# Patient Record
Sex: Male | Born: 1935 | Race: White | Hispanic: Yes | Marital: Married | Smoking: Former smoker
Health system: Southern US, Community
[De-identification: ages and names within clinical notes are randomized; demographics above are authoritative.]

## PROBLEM LIST (undated history)

## (undated) DIAGNOSIS — N183 Chronic kidney disease, stage 3 (moderate): Secondary | ICD-10-CM

## (undated) DIAGNOSIS — Z95810 Presence of automatic (implantable) cardiac defibrillator: Secondary | ICD-10-CM

## (undated) DIAGNOSIS — Z950 Presence of cardiac pacemaker: Secondary | ICD-10-CM

## (undated) DIAGNOSIS — Z8546 Personal history of malignant neoplasm of prostate: Secondary | ICD-10-CM

## (undated) DIAGNOSIS — C801 Malignant (primary) neoplasm, unspecified: Secondary | ICD-10-CM

## (undated) DIAGNOSIS — G4733 Obstructive sleep apnea (adult) (pediatric): Secondary | ICD-10-CM

## (undated) DIAGNOSIS — I340 Nonrheumatic mitral (valve) insufficiency: Secondary | ICD-10-CM

## (undated) DIAGNOSIS — I4891 Unspecified atrial fibrillation: Secondary | ICD-10-CM

## (undated) DIAGNOSIS — K7581 Nonalcoholic steatohepatitis (NASH): Secondary | ICD-10-CM

## (undated) DIAGNOSIS — I509 Heart failure, unspecified: Secondary | ICD-10-CM

## (undated) DIAGNOSIS — D696 Thrombocytopenia, unspecified: Secondary | ICD-10-CM

## (undated) DIAGNOSIS — I10 Essential (primary) hypertension: Secondary | ICD-10-CM

## (undated) HISTORY — PX: UVULECTOMY: SHX2631

## (undated) HISTORY — PX: APPENDECTOMY: SHX54

## (undated) HISTORY — PX: KNEE ARTHROSCOPY: SUR90

## (undated) HISTORY — PX: OTHER SURGICAL HISTORY: SHX169

## (undated) HISTORY — PX: COLON SURGERY: SHX602

## (undated) HISTORY — PX: SHOULDER SURGERY: SHX246

## (undated) HISTORY — PX: CHOLECYSTECTOMY: SHX55

---

## 1998-10-21 ENCOUNTER — Ambulatory Visit: Admission: RE | Admit: 1998-10-21 | Discharge: 1998-10-21 | Payer: Self-pay

## 2003-06-19 ENCOUNTER — Observation Stay (HOSPITAL_COMMUNITY): Admission: EM | Admit: 2003-06-19 | Discharge: 2003-06-19 | Payer: Self-pay | Admitting: Emergency Medicine

## 2006-02-19 ENCOUNTER — Emergency Department (HOSPITAL_COMMUNITY): Admission: EM | Admit: 2006-02-19 | Discharge: 2006-02-19 | Payer: Self-pay | Admitting: Emergency Medicine

## 2009-09-09 ENCOUNTER — Ambulatory Visit: Payer: Self-pay | Admitting: Infectious Diseases

## 2009-09-09 ENCOUNTER — Inpatient Hospital Stay (HOSPITAL_COMMUNITY): Admission: EM | Admit: 2009-09-09 | Discharge: 2009-09-10 | Payer: Self-pay | Admitting: Emergency Medicine

## 2011-01-15 LAB — COMPREHENSIVE METABOLIC PANEL
Alkaline Phosphatase: 61 U/L (ref 39–117)
BUN: 20 mg/dL (ref 6–23)
CO2: 24 mEq/L (ref 19–32)
Calcium: 8.8 mg/dL (ref 8.4–10.5)
Chloride: 108 mEq/L (ref 96–112)
Creatinine, Ser: 1.05 mg/dL (ref 0.4–1.5)
GFR calc Af Amer: >60 mL/min (ref >60)
Total Bilirubin: 1.7 mg/dL — ABNORMAL HIGH (ref 0.3–1.2)

## 2011-01-15 LAB — CBC
MCV: 100.7 fL — ABNORMAL HIGH (ref 78.0–100.0)
Platelets: 69 10*3/uL — ABNORMAL LOW (ref 150–400)
RDW: 12.6 % (ref 11.5–15.5)
WBC: 5.4 10*3/uL (ref 4.0–10.5)

## 2011-01-15 LAB — HEMOGLOBIN A1C: Mean Plasma Glucose: 140 mg/dL

## 2011-01-15 LAB — CARDIAC PANEL(CRET KIN+CKTOT+MB+TROPI)
CK, MB: 2.6 ng/mL (ref 0.3–4.0)
Relative Index: 2.5 (ref 0.0–2.5)
Total CK: 102 U/L (ref 7–232)
Troponin I: 0.01 ng/mL (ref 0.00–0.06)
Troponin I: 0.01 ng/mL (ref 0.00–0.06)

## 2011-01-15 LAB — APTT: aPTT: 28 seconds (ref 24–37)

## 2011-01-15 LAB — LIPASE, BLOOD: Lipase: 28 U/L (ref 11–59)

## 2011-01-15 LAB — URINALYSIS, ROUTINE W REFLEX MICROSCOPIC
Glucose, UA: NEGATIVE mg/dL
Nitrite: NEGATIVE
Specific Gravity, Urine: 1.017 (ref 1.005–1.030)
pH: 5 (ref 5.0–8.0)

## 2011-01-15 LAB — CK TOTAL AND CKMB (NOT AT ARMC): CK, MB: 2.1 ng/mL (ref 0.3–4.0)

## 2011-01-15 LAB — DIFFERENTIAL
Basophils Relative: 0 % (ref 0–1)
Eosinophils Relative: 1 % (ref 0–5)
Lymphocytes Relative: 15 % (ref 12–46)
Lymphs Abs: 0.8 10*3/uL (ref 0.7–4.0)
Monocytes Relative: 7 % (ref 3–12)
Neutro Abs: 4.2 10*3/uL (ref 1.7–7.7)

## 2011-01-15 LAB — URINE CULTURE
Colony Count: NO GROWTH
Culture: NO GROWTH

## 2011-01-15 LAB — GLUCOSE, CAPILLARY: Glucose-Capillary: 178 mg/dL — ABNORMAL HIGH (ref 70–99)

## 2011-01-15 LAB — PROTIME-INR
INR: 1.42 (ref 0.00–1.49)
Prothrombin Time: 17.2 seconds — ABNORMAL HIGH (ref 11.6–15.2)

## 2011-01-15 LAB — POCT CARDIAC MARKERS: Troponin i, poc: <0.05 ng/mL (ref 0.00–0.09)

## 2011-01-15 LAB — TROPONIN I: Troponin I: 0.01 ng/mL (ref 0.00–0.06)

## 2011-02-28 NOTE — Discharge Summary (Signed)
NAME:  Cameron, Acosta                        ACCOUNT NO.:  [id number]   MEDICAL RECORD NO.:  [id number]                   PATIENT TYPE:  INP   LOCATION:  3730                                 FACILITY:  [facility]   PHYSICIAN:  Deirdre Peer. Polite, M.D.              DATE OF BIRTH:  1936-05-17   DATE OF ADMISSION:  06/18/2003  DATE OF DISCHARGE:  06/19/2003                                 DISCHARGE SUMMARY   DISCHARGE DIAGNOSES:  1. Chest pain, noncardiac in origin.  2. Chronic atrial fibrillation on Coumadin therapy.  3. Hypertension.  4. Sleep apnea.  5. Mild diabetes.  6. Depression.  7. Cardiomyopathy with an EF of 38%.  8. Status post colon resection.  9. History of pneumonia.  10.      History of kidney stones.  11.      Hypotension, resolved.  Responsive to intravenous fluids.  12.      Dyspnea secondary to hypotension, resolved.   DISCHARGE MEDICATIONS:  1. Toprol 50 mg p.o. q.h.s.  2. Zoloft 50 mg p.o. q.h.s.  3. Lanoxin 0.125 mg p.o. q.h.s.  4. Ambien p.r.n.  5. Coumadin 6 mg p.o. q.h.s.  NOTE:  Patient is instructed not to take his Cozaar.   ALLERGIES:  No known drug allergies.   PROCEDURES:  None.   HISTORY OF PRESENT ILLNESS:  This is a man with an extensive medical  history, who presents with complaint of severe dizziness after eating  dinner.  He also experienced diaphoresis during this episode.  He went and  laid down on the sofa, at which time, he got up and went to the bathroom.  He became very dizzy in the bathroom and fell forward, although he did not  pass out.  He does not recall much except that he was mildly short of  breath.  His wife is a Engineer, civil (consulting) and reported that his blood pressure was 80  systolic at home.  He was transported to the ER where his blood pressure  initially was 86/64, pulse of 86 with atrial fibrillation.  Blood pressure  responded quite quickly to IV fluid and no further arrhythmias were  observed.  He is admitted for telemetry  monitoring.   HOSPITAL COURSE:  A 12-lead EKG in the emergency department reveals atrial  fibrillation with good rate control with a heart rate of 73.  There are no  ST or T-wave changes.  There is no significant ectopy noted.  The patient is  admitted to the telemetry unit in which he stayed in atrial fibrillation  rhythm with good rate controls throughout his hospitalization.  Three sets  of cardiac enzymes were performed and found to be negative.  The patient was  maintained on his outpatient medication regimen with the exception of his  Cozaar as it does contain a diuretic and it was thought that it may have  contributed to the patient's dehydration and dizziness.  The patient is  instructed not to resume his Cozaar until he sees his primary M.D.  He is a  patient of Dr. Bary Castilla, Cardiologist at [facility].  He is instructed to  follow up with their office as well.   The morning after admission, the patient denied any chest pain, shortness of  breath, nausea or dizziness.  He did not have a fever.  His vital signs were  stable.  Once his third set of cardiac enzymes came back, he was discharged  to home with a diagnosis of hypotension secondary to dehydration, likely  caused by diuretic use.  He is encouraged to keep up his fluid intake and to  see his primary M.D. and his cardiologist as soon as possible.   At the time of discharge, temperature is 99.2, blood pressure 140/70,  saturations are 97%, heart rate 78.   DISCHARGE LABORATORY DATA:  Sodium 141, potassium 3.9, glucose 119, BUN 16,  creatinine 0.9, total bilirubin 1.2, ALP 40, AST 22, ALT 31.  White blood  cell count 4.8, hemoglobin 15.2, hematocrit 43.2, platelet count 132,000.  UA was negative for infection or glucose, however, was positive for ketones  at the time of admission.  PTT 34, PT 20.8, INR 2.3, digoxin level less than  0.2.   CONSULTATIONS:  None.   DISCHARGE CONDITION:  Good.   DISPOSITION:  Discharged to  home with wife.   FOLLOW UP:  The patient is instructed to call his primary M.D. and his  Cardiologist, Dr. Assunta Found for followup as soon as possible.      Ellender Hose. Virl Son. Polite, M.D.    SMD/MEDQ  D:  06/19/2003  T:  06/19/2003  Job:  [id number]   cc:   [facility] Dr. Horris [phone]

## 2011-09-10 ENCOUNTER — Emergency Department (HOSPITAL_COMMUNITY)
Admission: EM | Admit: 2011-09-10 | Discharge: 2011-09-10 | Disposition: A | Discharge status: Home / Self Care | Payer: Medicare Other | Attending: Emergency Medicine | Admitting: Emergency Medicine

## 2011-09-10 ENCOUNTER — Other Ambulatory Visit: Payer: Self-pay

## 2011-09-10 ENCOUNTER — Encounter: Payer: Self-pay | Admitting: Emergency Medicine

## 2011-09-10 ENCOUNTER — Emergency Department (HOSPITAL_COMMUNITY): Payer: Medicare Other

## 2011-09-10 DIAGNOSIS — R0609 Other forms of dyspnea: Secondary | ICD-10-CM | POA: Insufficient documentation

## 2011-09-10 DIAGNOSIS — R61 Generalized hyperhidrosis: Secondary | ICD-10-CM | POA: Insufficient documentation

## 2011-09-10 DIAGNOSIS — I4891 Unspecified atrial fibrillation: Secondary | ICD-10-CM | POA: Insufficient documentation

## 2011-09-10 DIAGNOSIS — R404 Transient alteration of awareness: Secondary | ICD-10-CM | POA: Insufficient documentation

## 2011-09-10 DIAGNOSIS — R0600 Dyspnea, unspecified: Secondary | ICD-10-CM

## 2011-09-10 DIAGNOSIS — E669 Obesity, unspecified: Secondary | ICD-10-CM | POA: Insufficient documentation

## 2011-09-10 DIAGNOSIS — R0682 Tachypnea, not elsewhere classified: Secondary | ICD-10-CM | POA: Insufficient documentation

## 2011-09-10 DIAGNOSIS — R609 Edema, unspecified: Secondary | ICD-10-CM | POA: Insufficient documentation

## 2011-09-10 DIAGNOSIS — I10 Essential (primary) hypertension: Secondary | ICD-10-CM | POA: Insufficient documentation

## 2011-09-10 DIAGNOSIS — R0989 Other specified symptoms and signs involving the circulatory and respiratory systems: Secondary | ICD-10-CM | POA: Insufficient documentation

## 2011-09-10 DIAGNOSIS — J811 Chronic pulmonary edema: Secondary | ICD-10-CM

## 2011-09-10 HISTORY — DX: Unspecified atrial fibrillation: I48.91

## 2011-09-10 HISTORY — DX: Essential (primary) hypertension: I10

## 2011-09-10 HISTORY — DX: Malignant (primary) neoplasm, unspecified: C80.1

## 2011-09-10 LAB — BASIC METABOLIC PANEL
BUN: 35 mg/dL — ABNORMAL HIGH (ref 6–23)
CO2: 22 mEq/L (ref 19–32)
Calcium: 8.6 mg/dL (ref 8.4–10.5)
Chloride: 103 mEq/L (ref 96–112)
Creatinine, Ser: 1.3 mg/dL (ref 0.50–1.35)
GFR calc Af Amer: 61 mL/min — ABNORMAL LOW (ref >90)
GFR calc non Af Amer: 53 mL/min — ABNORMAL LOW (ref >90)
Glucose, Bld: 199 mg/dL — ABNORMAL HIGH (ref 70–99)
Potassium: 4.3 mEq/L (ref 3.5–5.1)
Sodium: 137 mEq/L (ref 135–145)

## 2011-09-10 LAB — CBC
HCT: 43.8 % (ref 39.0–52.0)
Hemoglobin: 15.6 g/dL (ref 13.0–17.0)
MCH: 33.3 pg (ref 26.0–34.0)
MCHC: 35.6 g/dL (ref 30.0–36.0)
MCV: 93.4 fL (ref 78.0–100.0)
Platelets: 104 10*3/uL — ABNORMAL LOW (ref 150–400)
RBC: 4.69 MIL/uL (ref 4.22–5.81)
RDW: 16.2 % — ABNORMAL HIGH (ref 11.5–15.5)
WBC: 7 10*3/uL (ref 4.0–10.5)

## 2011-09-10 LAB — PROTIME-INR
INR: 2.97 — ABNORMAL HIGH (ref 0.00–1.49)
Prothrombin Time: 31.4 seconds — ABNORMAL HIGH (ref 11.6–15.2)

## 2011-09-10 LAB — TROPONIN I: Troponin I: <0.3 ng/mL (ref <0.30)

## 2011-09-10 LAB — PRO B NATRIURETIC PEPTIDE: Pro B Natriuretic peptide (BNP): 475 pg/mL — ABNORMAL HIGH (ref 0–125)

## 2011-09-10 MED ORDER — ONDANSETRON HCL 4 MG/2ML IJ SOLN: 4.0000 mg | Freq: Three times a day (TID) | INTRAMUSCULAR | Status: DC | PRN

## 2011-09-10 MED ORDER — FUROSEMIDE 10 MG/ML IJ SOLN
INTRAMUSCULAR | Status: AC
  Administered 2011-09-10: 03:00:00 via INTRAVENOUS
  Filled 2011-09-10: qty 2

## 2011-09-10 MED ORDER — FUROSEMIDE 10 MG/ML IJ SOLN
INTRAMUSCULAR | Status: AC
  Filled 2011-09-10: qty 8

## 2011-09-10 MED ORDER — FUROSEMIDE 10 MG/ML IJ SOLN
INTRAMUSCULAR | Status: AC
  Administered 2011-09-10: 02:00:00 via INTRAVENOUS
  Filled 2011-09-10: qty 8

## 2011-09-10 MED ORDER — FUROSEMIDE 10 MG/ML IJ SOLN
INTRAMUSCULAR | Status: AC
  Filled 2011-09-10: qty 2

## 2011-09-10 NOTE — ED Provider Notes (Addendum)
History    75yM with brought in by EMS in respiratory distress. Acute and progressive dyspnea since this afternoon. Pt diaphoretic and tripoding on their arrival. CPAP, nitro and lasix in route. Per EMS and pt, symptoms improved. Denies CP now or previously. No fever or chills. No n/v. Denies recent med changes and reports compliance. Uses CPAP at night. Complicated cardiac hx.   CSN: [id number] Arrival date & time: 09/10/2011  1:12 AM   First MD Initiated Contact with Patient 09/10/11 0115      Chief Complaint  Patient presents with  . Respiratory Distress    (Consider location/radiation/quality/duration/timing/severity/associated sxs/prior treatment) HPI  Past Medical History  Diagnosis Date  . Hypertension   . Cancer   . Atrial fibrillation     Past Surgical History  Procedure Date  . Ablasion     No family history on file.  History  Substance Use Topics  . Smoking status: Not on file  . Smokeless tobacco: Not on file  . Alcohol Use:       Review of Systems  Level 5 caveat given respiratory distress.  Allergies  Review of patient's allergies indicates no known allergies.  Home Medications  No current outpatient prescriptions on file.  BP 124/71  Pulse 70  SpO2 100%  Physical Exam  Nursing note and vitals reviewed. Constitutional: He appears distressed.       Obese. Drowsy.  Eyes: Conjunctivae are normal. Pupils are equal, round, and reactive to light. Right eye exhibits no discharge. Left eye exhibits no discharge.  Neck: Neck supple.  Cardiovascular: Regular rhythm.   Pulmonary/Chest: No stridor. He is in respiratory distress. He has no wheezes. He has rales.       Tachypnea. B/l crackles R>L.   Abdominal: Soft. He exhibits no distension. There is no tenderness.  Musculoskeletal: He exhibits edema. He exhibits no tenderness.  Skin: No erythema. No pallor.  Psychiatric: Thought content normal.    ED Course  Procedures (including critical care  time)  Labs Reviewed  CBC - Abnormal; Notable for the following:    RDW 16.2 (*)    Platelets 104 (*)    All other components within normal limits  BASIC METABOLIC PANEL - Abnormal; Notable for the following:    Glucose, Bld 199 (*)    BUN 35 (*)    GFR calc non Af Amer 53 (*)    GFR calc Af Amer 61 (*)    All other components within normal limits  PROTIME-INR - Abnormal; Notable for the following:    Prothrombin Time 31.4 (*)    INR 2.97 (*)    All other components within normal limits  PRO B NATRIURETIC PEPTIDE - Abnormal; Notable for the following:    BNP, POC 475.0 (*)    All other components within normal limits  TROPONIN I   Dg Chest Portable 1 View  09/10/2011  *RADIOLOGY REPORT*  Clinical Data: Respiratory distress; history of congestive heart failure.  PORTABLE CHEST - 1 VIEW  Comparison: None.  Findings: The lungs are well-aerated.  Vascular congestion is noted.  Mild left perihilar and right basilar airspace opacities may reflect interstitial edema, though pneumonia could have a similar appearance.  There is no evidence of pleural effusion or pneumothorax.  The cardiomediastinal silhouette is mildly enlarged.  A pacemaker/AICD is noted overlying the right chest wall, with leads ending overlying the right ventricle and coronary sinus.  No acute osseous abnormalities are seen.  IMPRESSION: Vascular congestion and mild cardiomegaly; mild  left perihilar and right basilar airspace opacities may reflect interstitial edema, though pneumonia could have a similar appearance.  Original Report Authenticated By: Tonia Ghent, M.D.     No diagnosis found.  2:58 AM Called to bedside by nursing. Pt giving respiratory care hard time about wanting to use CPAP instead of BIPAP. Pt switched and now doesn't like our machine and requesting to use home CPAP. [place] Spaniel ok if would prefer. Pt and wife somewhat demanding and impatient. Second conversion had concerning  transfer to [facility] if going to be  in hospital for than day. Explained that could not be given definitive timeline right now. Again offered to transfer now for admission. Pt and wife declining though.   4:22 AM Pt seen by hospitalist. Re-examined by myself again as well. Pt is very comfortable appearing. Says at baseline and so does wife. Requesting to go home. I feel this is reasonable at this time. Understand signs/symptoms to return for immediate re-eval.  MDM  75yM with dyspnea. Clinical picture and CXR consistent with pulmonary edema. Pt reports compliance with medication. Consider MI but No CP, no diagnostic EKG changes and initial trop wnl. Auscultation difficult with PPV but no obvious murmur to suggest acute valvular dysfunction as precipitating event . EKG with a flutter and vpaced with 100% capture. Initial trop wnl. Doubt infectious. INR therapeutic.   Lasix prehospital and sublingual nitro. Given BP 110-140 do not think needs nitro gtt at this time especially given continued improvement of symptoms. Pt improved since presentation and more so from EMS arrival. Continue PPV. admit for observation.         Raeford Razor, MD 09/11/11 1610  Raeford Razor, MD 09/11/11 [phone]

## 2011-09-10 NOTE — ED Notes (Signed)
Per EMS- Pt coming from home, pt had been SOB all day, found in tripod position. Pt sounded like he was full of fluid. Pt had pacer and internal defib, Placed on CPAP, has had 1 NTG, 55 mg of Lasix, 18 gauge Left AC. Pt has history of HTN, CHF.

## 2011-09-10 NOTE — ED Notes (Signed)
Patient requesting change of BIpap to CPap at this time.

## 2013-10-13 HISTORY — PX: COLONOSCOPY: SHX174

## 2014-05-10 ENCOUNTER — Emergency Department (HOSPITAL_COMMUNITY)
Admission: EM | Admit: 2014-05-10 | Discharge: 2014-05-10 | Disposition: A | Discharge status: Home / Self Care | Payer: Medicare Other | Attending: Emergency Medicine | Admitting: Emergency Medicine

## 2014-05-10 ENCOUNTER — Encounter (HOSPITAL_COMMUNITY): Payer: Self-pay | Admitting: Emergency Medicine

## 2014-05-10 DIAGNOSIS — R338 Other retention of urine: Secondary | ICD-10-CM

## 2014-05-10 DIAGNOSIS — Z79899 Other long term (current) drug therapy: Secondary | ICD-10-CM | POA: Diagnosis not present

## 2014-05-10 DIAGNOSIS — Z950 Presence of cardiac pacemaker: Secondary | ICD-10-CM | POA: Diagnosis not present

## 2014-05-10 DIAGNOSIS — Z8551 Personal history of malignant neoplasm of bladder: Secondary | ICD-10-CM | POA: Diagnosis not present

## 2014-05-10 DIAGNOSIS — I4891 Unspecified atrial fibrillation: Secondary | ICD-10-CM | POA: Insufficient documentation

## 2014-05-10 DIAGNOSIS — Z792 Long term (current) use of antibiotics: Secondary | ICD-10-CM | POA: Insufficient documentation

## 2014-05-10 DIAGNOSIS — R339 Retention of urine, unspecified: Secondary | ICD-10-CM | POA: Insufficient documentation

## 2014-05-10 DIAGNOSIS — Z7901 Long term (current) use of anticoagulants: Secondary | ICD-10-CM | POA: Diagnosis not present

## 2014-05-10 DIAGNOSIS — Z87891 Personal history of nicotine dependence: Secondary | ICD-10-CM | POA: Insufficient documentation

## 2014-05-10 DIAGNOSIS — I10 Essential (primary) hypertension: Secondary | ICD-10-CM | POA: Insufficient documentation

## 2014-05-10 HISTORY — DX: Presence of cardiac pacemaker: Z95.0

## 2014-05-10 NOTE — Discharge Instructions (Signed)
Acute Urinary Retention  Acute urinary retention is when you are unable to pee (urinate). Acute urinary retention is common in older men. Prostates can get bigger, which blocks the flow of pee.   HOME CARE  · Drink enough fluids to keep your pee clear or pale yellow.  · If you are sent home with a tube that drains the bladder (catheter), there will be a drainage bag attached to it. There are two types of bags. One is big that you can wear at night without having to empty it. One is smaller and needs to be emptied more often.  ¨ Keep the drainage bag empty.  ¨ Keep the drainage bag lower than your catheter.  · Only take medicine as told by your doctor.  GET HELP IF:  · You have a low-grade fever.  · You have spasms or you are leaking pee when you have spasms.  GET HELP RIGHT AWAY IF:   · You have chills or a fever.  · Your catheter stops draining pee.  · Your catheter falls out.  · You have increased bleeding that does not stop after you have rested and increased the amount of fluids you had been drinking.  MAKE SURE YOU:   · Understand these instructions.  · Will watch your condition.  · Will get help right away if you are not doing well or get worse.  Document Released: 03/17/2008 Document Revised: 07/20/2013 Document Reviewed: 03/10/2013  ExitCare® Patient Information ©2015 [employer]. This information is not intended to replace advice given to you by your health care provider. Make sure you discuss any questions you have with your health care provider.

## 2014-05-10 NOTE — ED Notes (Signed)
Pt presents with c/o urinary retention, pt with bladder CA, not yet started treatment, following up with urology tomorrow and was supposed to take out his previous indwelling prior to appt, but accidentally took it out today.  Pt reports 7/10 suprapubic pain. Denies other complaints.

## 2014-05-10 NOTE — ED Provider Notes (Signed)
CSN: [id number]     Arrival date & time 05/10/14  1449 History   First MD Initiated Contact with Patient 05/10/14 1454     Chief Complaint  Patient presents with  . Urinary Retention     (Consider location/radiation/quality/duration/timing/severity/associated sxs/prior Treatment) HPI 78 year old male with history of bladder cancer with recurrent urinary retention has a urology appointment tomorrow but accidentally fell today was his urology appointment and he was instructed to remove his Foley catheter 4 hours prior to his appointment. The patient removed his Foley catheter today and has recurrent urinary retention and realized today was not the day of his urology appointment so came to the ED for Foley catheter replacement. He is no fever no vomiting no abdominal pain no chest pain shortness breath no other concerns. His Foley catheter had been draining clear urine without difficulty. Past Medical History  Diagnosis Date  . Hypertension   . Cancer   . Atrial fibrillation   . Pacemaker    Past Surgical History  Procedure Laterality Date  . Ablasion     No family history on file. History  Substance Use Topics  . Smoking status: Former Research scientist (life sciences)  . Smokeless tobacco: Not on file  . Alcohol Use: Yes     Comment: occasionally    Review of Systems 10 Systems reviewed and are negative for acute change except as noted in the HPI.   Allergies  Review of patient's allergies indicates no known allergies.  Home Medications   Prior to Admission medications   Medication Sig Start Date End Date Taking? Authorizing Provider  furosemide (LASIX) 20 MG tablet Take 60 mg by mouth daily.      Historical Provider, MD  glipiZIDE (GLUCOTROL XL) 2.5 MG 24 hr tablet Take 2.5 mg by mouth daily.      Historical Provider, MD  losartan (COZAAR) 50 MG tablet Take 50 mg by mouth every evening.      Historical Provider, MD  metoprolol (TOPROL-XL) 50 MG 24 hr tablet Take 50 mg by mouth daily.       Historical Provider, MD  omega-3 acid ethyl esters (LOVAZA) 1 G capsule Take 2 g by mouth 2 (two) times daily.      Historical Provider, MD  rosuvastatin (CRESTOR) 10 MG tablet Take 10 mg by mouth daily.      Historical Provider, MD  testosterone cypionate (DEPO-TESTOSTERONE) 200 MG/ML injection Inject 200 mg into the muscle every 14 (fourteen) days.      Historical Provider, MD  tetracycline (ACHROMYCIN,SUMYCIN) 250 MG capsule Take 250 mg by mouth daily.      Historical Provider, MD  warfarin (COUMADIN) 5 MG tablet Take 5 mg by mouth daily.     Historical Provider, MD  zolpidem (AMBIEN) 10 MG tablet Take 10 mg by mouth at bedtime.      Historical Provider, MD   BP 117/76  Pulse 70  Temp(Src) 98.6 F (37 C) (Oral)  Resp 18  SpO2 100% Physical Exam  Nursing note and vitals reviewed. Constitutional:  Awake, alert, nontoxic appearance.  HENT:  Head: Atraumatic.  Eyes: Right eye exhibits no discharge. Left eye exhibits no discharge.  Neck: Neck supple.  Cardiovascular: Normal rate and regular rhythm.   No murmur heard. Pulmonary/Chest: Effort normal and breath sounds normal. No respiratory distress. He has no wheezes. He has no rales. He exhibits no tenderness.  Abdominal: Soft. Bowel sounds are normal. He exhibits no distension. There is no tenderness. There is no rebound and no guarding.  Musculoskeletal: He exhibits no tenderness.  Baseline ROM, no obvious new focal weakness.  Neurological: He is alert.  Mental status and motor strength appears baseline for patient and situation.  Skin: No rash noted.  Psychiatric: He has a normal mood and affect.    ED Course  Procedures (including critical care time) RN bladder scan with 39ml urine calculated. Patient / Family / Caregiver informed of clinical course, understand medical decision-making process, and agree with plan. Labs Review Labs Reviewed - No data to display  Imaging Review No results found.   EKG  Interpretation None      MDM   Final diagnoses:  Acute urinary retention    I doubt any other EMC precluding discharge at this time including, but not necessarily limited to the following:SBI.   Babette Relic, MD 05/10/14 2231

## 2014-06-25 ENCOUNTER — Encounter (HOSPITAL_COMMUNITY): Payer: Self-pay | Admitting: Emergency Medicine

## 2014-06-25 ENCOUNTER — Emergency Department (HOSPITAL_COMMUNITY): Payer: Medicare Other

## 2014-06-25 ENCOUNTER — Inpatient Hospital Stay (HOSPITAL_COMMUNITY)
Admission: EM | Admit: 2014-06-25 | Discharge: 2014-06-27 | DRG: 057 | Disposition: A | Discharge status: Home / Self Care | Payer: Medicare Other | Attending: Internal Medicine | Admitting: Internal Medicine

## 2014-06-25 DIAGNOSIS — I428 Other cardiomyopathies: Secondary | ICD-10-CM | POA: Diagnosis present

## 2014-06-25 DIAGNOSIS — N058 Unspecified nephritic syndrome with other morphologic changes: Secondary | ICD-10-CM | POA: Diagnosis present

## 2014-06-25 DIAGNOSIS — N390 Urinary tract infection, site not specified: Secondary | ICD-10-CM | POA: Diagnosis present

## 2014-06-25 DIAGNOSIS — E860 Dehydration: Secondary | ICD-10-CM | POA: Diagnosis present

## 2014-06-25 DIAGNOSIS — I639 Cerebral infarction, unspecified: Secondary | ICD-10-CM

## 2014-06-25 DIAGNOSIS — D696 Thrombocytopenia, unspecified: Secondary | ICD-10-CM | POA: Diagnosis present

## 2014-06-25 DIAGNOSIS — K6289 Other specified diseases of anus and rectum: Secondary | ICD-10-CM | POA: Diagnosis present

## 2014-06-25 DIAGNOSIS — Z95810 Presence of automatic (implantable) cardiac defibrillator: Secondary | ICD-10-CM | POA: Diagnosis present

## 2014-06-25 DIAGNOSIS — E1129 Type 2 diabetes mellitus with other diabetic kidney complication: Secondary | ICD-10-CM | POA: Diagnosis present

## 2014-06-25 DIAGNOSIS — Z66 Do not resuscitate: Secondary | ICD-10-CM | POA: Diagnosis present

## 2014-06-25 DIAGNOSIS — Z87891 Personal history of nicotine dependence: Secondary | ICD-10-CM

## 2014-06-25 DIAGNOSIS — E785 Hyperlipidemia, unspecified: Secondary | ICD-10-CM | POA: Diagnosis present

## 2014-06-25 DIAGNOSIS — Z7901 Long term (current) use of anticoagulants: Secondary | ICD-10-CM | POA: Diagnosis not present

## 2014-06-25 DIAGNOSIS — Z950 Presence of cardiac pacemaker: Secondary | ICD-10-CM | POA: Diagnosis not present

## 2014-06-25 DIAGNOSIS — I482 Chronic atrial fibrillation, unspecified: Secondary | ICD-10-CM

## 2014-06-25 DIAGNOSIS — N183 Chronic kidney disease, stage 3 unspecified: Secondary | ICD-10-CM | POA: Diagnosis present

## 2014-06-25 DIAGNOSIS — Z79899 Other long term (current) drug therapy: Secondary | ICD-10-CM | POA: Diagnosis not present

## 2014-06-25 DIAGNOSIS — I129 Hypertensive chronic kidney disease with stage 1 through stage 4 chronic kidney disease, or unspecified chronic kidney disease: Secondary | ICD-10-CM | POA: Diagnosis present

## 2014-06-25 DIAGNOSIS — I2510 Atherosclerotic heart disease of native coronary artery without angina pectoris: Secondary | ICD-10-CM | POA: Diagnosis present

## 2014-06-25 DIAGNOSIS — K7581 Nonalcoholic steatohepatitis (NASH): Secondary | ICD-10-CM | POA: Diagnosis present

## 2014-06-25 DIAGNOSIS — R29898 Other symptoms and signs involving the musculoskeletal system: Secondary | ICD-10-CM | POA: Diagnosis not present

## 2014-06-25 DIAGNOSIS — G4733 Obstructive sleep apnea (adult) (pediatric): Secondary | ICD-10-CM | POA: Diagnosis present

## 2014-06-25 DIAGNOSIS — G8190 Hemiplegia, unspecified affecting unspecified side: Secondary | ICD-10-CM | POA: Diagnosis present

## 2014-06-25 DIAGNOSIS — N35919 Unspecified urethral stricture, male, unspecified site: Secondary | ICD-10-CM | POA: Diagnosis present

## 2014-06-25 DIAGNOSIS — G8194 Hemiplegia, unspecified affecting left nondominant side: Secondary | ICD-10-CM | POA: Diagnosis present

## 2014-06-25 DIAGNOSIS — N179 Acute kidney failure, unspecified: Secondary | ICD-10-CM | POA: Diagnosis present

## 2014-06-25 DIAGNOSIS — B952 Enterococcus as the cause of diseases classified elsewhere: Secondary | ICD-10-CM | POA: Diagnosis present

## 2014-06-25 DIAGNOSIS — I6350 Cerebral infarction due to unspecified occlusion or stenosis of unspecified cerebral artery: Secondary | ICD-10-CM

## 2014-06-25 DIAGNOSIS — Z8546 Personal history of malignant neoplasm of prostate: Secondary | ICD-10-CM

## 2014-06-25 DIAGNOSIS — E1122 Type 2 diabetes mellitus with diabetic chronic kidney disease: Secondary | ICD-10-CM

## 2014-06-25 DIAGNOSIS — T451X5A Adverse effect of antineoplastic and immunosuppressive drugs, initial encounter: Secondary | ICD-10-CM | POA: Diagnosis present

## 2014-06-25 DIAGNOSIS — I4891 Unspecified atrial fibrillation: Secondary | ICD-10-CM | POA: Clinically undetermined

## 2014-06-25 DIAGNOSIS — K627 Radiation proctitis: Secondary | ICD-10-CM | POA: Diagnosis present

## 2014-06-25 DIAGNOSIS — K7689 Other specified diseases of liver: Secondary | ICD-10-CM | POA: Diagnosis present

## 2014-06-25 DIAGNOSIS — I4819 Other persistent atrial fibrillation: Secondary | ICD-10-CM

## 2014-06-25 HISTORY — DX: Nonalcoholic steatohepatitis (NASH): K75.81

## 2014-06-25 HISTORY — DX: Obstructive sleep apnea (adult) (pediatric): G47.33

## 2014-06-25 HISTORY — DX: Presence of cardiac pacemaker: Z95.0

## 2014-06-25 HISTORY — DX: Presence of automatic (implantable) cardiac defibrillator: Z95.810

## 2014-06-25 HISTORY — DX: Chronic kidney disease, stage 3 (moderate): N18.3

## 2014-06-25 HISTORY — DX: Personal history of malignant neoplasm of prostate: Z85.46

## 2014-06-25 HISTORY — DX: Thrombocytopenia, unspecified: D69.6

## 2014-06-25 LAB — I-STAT CHEM 8, ED
BUN: 46 mg/dL — ABNORMAL HIGH (ref 6–23)
Calcium, Ion: 1.13 mmol/L (ref 1.13–1.30)
Chloride: 102 mEq/L (ref 96–112)
Creatinine, Ser: 2.6 mg/dL — ABNORMAL HIGH (ref 0.50–1.35)
Glucose, Bld: 224 mg/dL — ABNORMAL HIGH (ref 70–99)
HCT: 37 % — ABNORMAL LOW (ref 39.0–52.0)
Hemoglobin: 12.6 g/dL — ABNORMAL LOW (ref 13.0–17.0)
Potassium: 3.9 mEq/L (ref 3.7–5.3)
Sodium: 140 mEq/L (ref 137–147)
TCO2: 24 mmol/L (ref 0–100)

## 2014-06-25 LAB — COMPREHENSIVE METABOLIC PANEL
ALBUMIN: 3.6 g/dL (ref 3.5–5.2)
ALK PHOS: 60 U/L (ref 39–117)
ALT: 28 U/L (ref 0–53)
ANION GAP: 23 — AB (ref 5–15)
AST: 30 U/L (ref 0–37)
BILIRUBIN TOTAL: 0.7 mg/dL (ref 0.3–1.2)
BUN: 48 mg/dL — AB (ref 6–23)
CHLORIDE: 97 meq/L (ref 96–112)
CO2: 23 meq/L (ref 19–32)
CREATININE: 2.25 mg/dL — AB (ref 0.50–1.35)
Calcium: 9.5 mg/dL (ref 8.4–10.5)
GFR, EST AFRICAN AMERICAN: 31 mL/min — AB (ref >90)
GFR, EST NON AFRICAN AMERICAN: 27 mL/min — AB (ref >90)
GLUCOSE: 225 mg/dL — AB (ref 70–99)
Potassium: 4.2 mEq/L (ref 3.7–5.3)
Sodium: 143 mEq/L (ref 137–147)
Total Protein: 7.3 g/dL (ref 6.0–8.3)

## 2014-06-25 LAB — DIFFERENTIAL
Basophils Absolute: 0 10*3/uL (ref 0.0–0.1)
Basophils Relative: 1 % (ref 0–1)
Eosinophils Absolute: 0.1 10*3/uL (ref 0.0–0.7)
Eosinophils Relative: 2 % (ref 0–5)
LYMPHS ABS: 1.5 10*3/uL (ref 0.7–4.0)
Lymphocytes Relative: 24 % (ref 12–46)
MONO ABS: 0.5 10*3/uL (ref 0.1–1.0)
MONOS PCT: 8 % (ref 3–12)
NEUTROS ABS: 4 10*3/uL (ref 1.7–7.7)
Neutrophils Relative %: 66 % (ref 43–77)

## 2014-06-25 LAB — CBC
HEMATOCRIT: 35.3 % — AB (ref 39.0–52.0)
HEMOGLOBIN: 12.5 g/dL — AB (ref 13.0–17.0)
MCH: 33.8 pg (ref 26.0–34.0)
MCHC: 35.4 g/dL (ref 30.0–36.0)
MCV: 95.4 fL (ref 78.0–100.0)
Platelets: 95 10*3/uL — ABNORMAL LOW (ref 150–400)
RBC: 3.7 MIL/uL — AB (ref 4.22–5.81)
RDW: 13.7 % (ref 11.5–15.5)
WBC: 6.2 10*3/uL (ref 4.0–10.5)

## 2014-06-25 LAB — APTT: APTT: 34 s (ref 24–37)

## 2014-06-25 LAB — I-STAT TROPONIN, ED: TROPONIN I, POC: 0.01 ng/mL (ref 0.00–0.08)

## 2014-06-25 LAB — ETHANOL: ALCOHOL ETHYL (B): 97 mg/dL — AB (ref 0–11)

## 2014-06-25 LAB — PROTIME-INR
INR: 2.56 — ABNORMAL HIGH (ref 0.00–1.49)
Prothrombin Time: 27.5 seconds — ABNORMAL HIGH (ref 11.6–15.2)

## 2014-06-25 LAB — CBG MONITORING, ED: Glucose-Capillary: 242 mg/dL — ABNORMAL HIGH (ref 70–99)

## 2014-06-25 MED ORDER — HYDROCODONE-ACETAMINOPHEN 5-325 MG PO TABS
1.0000 | ORAL_TABLET | ORAL | Status: DC | PRN
  Administered 2014-06-25: 1 via ORAL
  Administered 2014-06-26 – 2014-06-27 (×3): 2 via ORAL
  Filled 2014-06-25: qty 1
  Filled 2014-06-25 (×3): qty 2

## 2014-06-25 NOTE — ED Notes (Signed)
Pt placed on CPAP by wife. No facial droop noted or left sided weakness. Pt c/o headache 5/10, Wife at bedside. Waiting for admission bed

## 2014-06-25 NOTE — ED Notes (Addendum)
Pt getting out of bathtub tonight and slipped and fell. Reports "hitting everything" Denies LOC. Pt reports sitting in his recliner after incident, felt diaphoretic, increased shortness of breath, and "felt like I needed to have a bowel movement." When pt got to the bathroom, he reported increased weakness and unable to get up from the toilet. Pt was recently admitted to the hospital for rectal bleeding and "procedure" and received "radiation for cancerous tumors in my bladder." Pt self-caths at home to urinate. Abdominal tenderness noted in LLQ, pt requesting catheter for urination.

## 2014-06-25 NOTE — H&P (Signed)
[facility] History and Physical  Cameron Acosta ION:[id number] DOB: 01-10-1936 DOA: 06/25/2014  PCP: PROVIDER NOT IN SYSTEM   Chief Complaint: Fall, headache x1 day duration  HPI: Cameron Acosta is a 78 y.o. male pleasant man with history of prostate cancer status post radiation with consequent urethral stricture, self-catheterization at home, hypertension, atrial fibrillation status post pacemaker insertion, severe obstructive sleep apnea on home CPAP was in his usual state of health until sometimes this afternoon when he started feeling of easy, had the urge to defecate, could not make it to the bathroom, became sweaty and dizzy. He called the wife who called 911, patient was found to be diaphoretic. He denied chest pain but started to have headache. He is a lifetime anticoagulation with Coumadin 6 mg daily for persistent atrial fibrillation, he has pacemaker implantation,. He fell in the bathtub sometimes in the morning a few hours before his symptoms started, he reported to have hit his head on the wall, no immediate loss of consciousness. She denied abdominal pain, no chest pain, no blood in the stool no nausea or vomiting. His baseline creatinine is 1.3.  General: The patient denies anorexia, fever, weight loss Cardiac: Denies chest pain, syncope, palpitations, pedal edema  Respiratory: Denies cough, shortness of breath, wheezing GI: Denies severe indigestion/heartburn, abdominal pain, nausea, vomiting, diarrhea and constipation GU: Denies hematuria, incontinence, dysuria  Musculoskeletal: Denies arthritis  Skin: Denies suspicious skin lesions Neurologic: Denies focal weakness or numbness, change in vision  Past Medical History  Diagnosis Date  . Hypertension   . Cancer   . Atrial fibrillation   . Pacemaker     Past Surgical History  Procedure Laterality Date  . Ablasion      Social History: Drinks alcohol Lives at home with wife  Intact ADLs  No Known  Allergies  History reviewed. No pertinent family history.    Prior to Admission medications   Medication Sig Start Date End Date Taking? Authorizing Provider  furosemide (LASIX) 20 MG tablet Take 60 mg by mouth daily.      Historical Provider, MD  glipiZIDE (GLUCOTROL XL) 2.5 MG 24 hr tablet Take 2.5 mg by mouth daily.      Historical Provider, MD  losartan (COZAAR) 50 MG tablet Take 50 mg by mouth every evening.      Historical Provider, MD  metoprolol (TOPROL-XL) 50 MG 24 hr tablet Take 50 mg by mouth daily.      Historical Provider, MD  omega-3 acid ethyl esters (LOVAZA) 1 G capsule Take 2 g by mouth 2 (two) times daily.      Historical Provider, MD  rosuvastatin (CRESTOR) 10 MG tablet Take 10 mg by mouth daily.      Historical Provider, MD  testosterone cypionate (DEPO-TESTOSTERONE) 200 MG/ML injection Inject 200 mg into the muscle every 14 (fourteen) days.      Historical Provider, MD  tetracycline (ACHROMYCIN,SUMYCIN) 250 MG capsule Take 250 mg by mouth daily.      Historical Provider, MD  warfarin (COUMADIN) 5 MG tablet Take 5 mg by mouth daily.     Historical Provider, MD  zolpidem (AMBIEN) 10 MG tablet Take 10 mg by mouth at bedtime.      Historical Provider, MD     Physical Exam: Filed Vitals:   06/25/14 2300 06/25/14 2330 06/25/14 2339 06/26/14 0000  BP: 117/61 141/69  141/72  Pulse: 69 80  69  Temp:   98.5 F (36.9 C)   TempSrc:  Resp: 14 17  16   SpO2: 99% 100%  100%     General: Elderly man, obese, not in obvious distress, but snoring HEENT: Normocephalic and Atraumatic, Mucous membranes pink                PERRLA; EOM intact; No scleral icterus,                 Nares: Patent, Oropharynx: Clear, Fair Dentition                 Neck: FROM, no cervical lymphadenopathy, thyromegaly, carotid bruit or JVD;  Breasts: deferred CHEST WALL: No tenderness, pacemaker in situ CHEST: Normal respiration, clear to auscultation bilaterally  HEART: Regular rate and rhythm; no  murmurs rubs or gallops  BACK: No kyphosis or scoliosis; no CVA tenderness  ABDOMEN: Positive Bowel Sounds, soft, non-tender; no masses, no organomegaly Rectal Exam: deferred EXTREMITIES: No cyanosis, clubbing, or edema Genitalia: not examined  SKIN:  no rash or ulceration  CNS: Alert and Oriented x 4, Nonfocal exam, CN 2-12 intact  Labs on Admission:  Basic Metabolic Panel:  Recent Labs Lab 06/25/14 2147 06/25/14 2153  NA 143 140  K 4.2 3.9  CL 97 102  CO2 23  --   GLUCOSE 225* 224*  BUN 48* 46*  CREATININE 2.25* 2.60*  CALCIUM 9.5  --    Liver Function Tests:  Recent Labs Lab 06/25/14 2147  AST 30  ALT 28  ALKPHOS 60  BILITOT 0.7  PROT 7.3  ALBUMIN 3.6   No results found for this basename: LIPASE, AMYLASE,  in the last 168 hours No results found for this basename: AMMONIA,  in the last 168 hours CBC:  Recent Labs Lab 06/25/14 2147 06/25/14 2153  WBC 6.2  --   NEUTROABS 4.0  --   HGB 12.5* 12.6*  HCT 35.3* 37.0*  MCV 95.4  --   PLT 95*  --    Cardiac Enzymes: No results found for this basename: CKTOTAL, CKMB, CKMBINDEX, TROPONINI,  in the last 168 hours  BNP (last 3 results) No results found for this basename: PROBNP,  in the last 8760 hours CBG:  Recent Labs Lab 06/25/14 2211  GLUCAP 242*    Radiological Exams on Admission: Ct Head Wo Contrast  06/25/2014   CLINICAL DATA:  Code stroke. Weakness in the left leg noted around 5 p.m. Patient fell. Shortness of breath. Diaphoretic. Weakness. Left-sided facial paralysis.  EXAM: CT HEAD WITHOUT CONTRAST  TECHNIQUE: Contiguous axial images were obtained from the base of the skull through the vertex without intravenous contrast.  COMPARISON:  None.  FINDINGS: There is significant central and cortical atrophy. Periventricular white matter changes are consistent with small vessel disease. There is no intra or extra-axial fluid collection or mass lesion. The basilar cisterns and ventricles have a normal  appearance. There is no CT evidence for acute infarction or hemorrhage. No calvarial fracture. There is atherosclerotic calcification of the internal carotid arteries.  IMPRESSION: 1.  No evidence for acute intracranial abnormality. 2. Atrophy and small vessel disease. 3. Critical Value/emergent results were called by telephone at the time of interpretation on 06/25/2014 at 10:20 pm to Dr. Doy Mince, who verbally acknowledged these results.   Electronically Signed   By: Shon Hale M.D.   On: 06/25/2014 22:23    EKG: Independently reviewed. Atrial fibrillation, paced rhythm, no acute ST- T wave changes  Assessment/Plan Principal Problem:   Acute renal failure Active Problems:   Dehydration  Atrial fibrillation  -patient mostly has dehydration with acute kidney injury, his alcohol level was elevated, patient fell in the bathtub, subsequently has diaphoresis and headache. CT head is negative for acute disease  Gentle rehydration  Hold torsemide in view of acute renal failure and evidence of dehydration  Neuro checks  Restart other home medications  CPAP  Fall precautions  Telemetry, cardiac workup  Urinalysis  Foley's catheterization  Coumadin with pharmacy dosing  Consulted: Neurologist from the ER  Code Status: Full code  Family Communication: Discussed extensively with daughter at the bedside  DVT Prophylaxis: Coumadin per pharmacy dosage  Time spent: 70 minutes  Ronnae Kaser, MD [facility]  If 7PM-7AM, please contact night-coverage [email or web address] 06/26/2014, 1:15 AM

## 2014-06-25 NOTE — Consult Note (Addendum)
Referring Physician: Audie Pinto    Chief Complaint: Left sided weakness, diplopia  HPI: Cameron Acosta is an 79 y.o. male who reports the when he went to take a bath this evening he was at baseline.  When he attempted to get out of the tub his left leg was weak and he fell.  He eventually was able to get up but remained weak.  He did not make his wife aware. After eating dinner he attempted to go upstairs.  He had difficulty but was able to make it.  At 2020 his wife noted that he was having weakness on the left and EMS was called.  Patient was brought in as a code stroke.  Initial NIHSS of 4.    Date last known well: Date: 06/25/2014 Time last known well: Time: 17:00 tPA Given: No: Outside time window, On Coumadin with therapeutic INR  Past Medical History  Diagnosis Date  . Hypertension   . Cancer   . Atrial fibrillation   . Pacemaker     Past Surgical History  Procedure Laterality Date  . Ablasion      Family history: Mother died after a hip fracture.  Father died of an MI.  Social History:  reports that he has quit smoking. He does not have any smokeless tobacco history on file. He reports that he drinks alcohol. His drug history is not on file.  Allergies: No Known Allergies  Medications: I have reviewed the patient's current medications. Prior to Admission:  Current outpatient prescriptions: furosemide (LASIX) 20 MG tablet, Take 60 mg by mouth daily.  , Disp: , Rfl: ;   glipiZIDE (GLUCOTROL XL) 2.5 MG 24 hr tablet, Take 2.5 mg by mouth daily.  , Disp: , Rfl: ;   losartan (COZAAR) 50 MG tablet, Take 50 mg by mouth every evening.  , Disp: , Rfl: ;   metoprolol (TOPROL-XL) 50 MG 24 hr tablet, Take 50 mg by mouth daily.  , Disp: , Rfl:  omega-3 acid ethyl esters (LOVAZA) 1 G capsule, Take 2 g by mouth 2 (two) times daily.  , Disp: , Rfl: ;   rosuvastatin (CRESTOR) 10 MG tablet, Take 10 mg by mouth daily.  , Disp: , Rfl: ;   testosterone cypionate (DEPO-TESTOSTERONE) 200 MG/ML  injection, Inject 200 mg into the muscle every 14 (fourteen) days.  , Disp: , Rfl: tetracycline (ACHROMYCIN,SUMYCIN) 250 MG capsule, Take 250 mg by mouth daily.  , Disp: , Rfl:  warfarin (COUMADIN) 5 MG tablet, Take 5 mg by mouth daily. , Disp: , Rfl: ;   zolpidem (AMBIEN) 10 MG tablet, Take 10 mg by mouth at bedtime.  , Disp: , Rfl:   ROS: History obtained from the patient  General ROS: negative for - chills, fatigue, fever, night sweats, weight gain or weight loss Psychological ROS: negative for - behavioral disorder, hallucinations, memory difficulties, mood swings or suicidal ideation Ophthalmic ROS: negative for - blurry vision, double vision, eye pain or loss of vision ENT ROS: negative for - epistaxis, nasal discharge, oral lesions, sore throat, tinnitus or vertigo Allergy and Immunology ROS: negative for - hives or itchy/watery eyes Hematological and Lymphatic ROS: negative for - bleeding problems, bruising or swollen lymph nodes Endocrine ROS: negative for - galactorrhea, hair pattern changes, polydipsia/polyuria or temperature intolerance Respiratory ROS: negative for - cough, hemoptysis, shortness of breath or wheezing Cardiovascular ROS: negative for - chest pain, dyspnea on exertion, edema or irregular heartbeat Gastrointestinal ROS: negative for - abdominal pain, diarrhea, hematemesis,  nausea/vomiting or stool incontinence Genito-Urinary ROS: negative for - dysuria, hematuria, incontinence or urinary frequency/urgency Musculoskeletal ROS: negative for - joint swelling or muscular weakness Neurological ROS: as noted in HPI Dermatological ROS: burns on stomach from cooking  Physical Examination: Blood pressure 141/72, pulse 69, temperature 98.5 F (36.9 C), resp. rate 16, SpO2 100.00%.  Neurologic Examination: Mental Status: Alert, oriented, thought content appropriate.  Speech fluent without evidence of aphasia.  Slurring of speech.  Able to follow 3 step commands without  difficulty. Cranial Nerves: II: Discs flat bilaterally; Visual fields grossly normal, pupils equal, round, reactive to light and accommodation III,IV, VI: ptosis not present, extra-ocular motions intact bilaterally but patient reports diplopia, horizontal V,VII: decrease in left NLF, facial light touch sensation decreased on the right VIII: hearing normal bilaterally IX,X: gag reflex present XI: bilateral shoulder shrug XII: midline tongue extension Motor: Right : Upper extremity   5/5    Left:     Upper extremity   5-/5  Lower extremity   5/5     Lower extremity   4+/5 Tone and bulk:normal tone throughout; no atrophy noted Sensory: Pinprick and light touch intact throughout, bilaterally Deep Tendon Reflexes: 2+ and symmetric in the upper extremities, trace at the knees and absent at the ankles Plantars: Right: downgoing   Left: downgoing Cerebellar: normal finger-to-nose and normal heel-to-shin testing bilaterally Gait: Unable to test safely due to weakness CV: pulses palpable throughout     Laboratory Studies:  Basic Metabolic Panel:  Recent Labs Lab 06/25/14 2147 06/25/14 2153  NA 143 140  K 4.2 3.9  CL 97 102  CO2 23  --   GLUCOSE 225* 224*  BUN 48* 46*  CREATININE 2.25* 2.60*  CALCIUM 9.5  --     Liver Function Tests:  Recent Labs Lab 06/25/14 2147  AST 30  ALT 28  ALKPHOS 60  BILITOT 0.7  PROT 7.3  ALBUMIN 3.6   No results found for this basename: LIPASE, AMYLASE,  in the last 168 hours No results found for this basename: AMMONIA,  in the last 168 hours  CBC:  Recent Labs Lab 06/25/14 2147 06/25/14 2153  WBC 6.2  --   NEUTROABS 4.0  --   HGB 12.5* 12.6*  HCT 35.3* 37.0*  MCV 95.4  --   PLT 95*  --     Cardiac Enzymes: No results found for this basename: CKTOTAL, CKMB, CKMBINDEX, TROPONINI,  in the last 168 hours  BNP: No components found with this basename: POCBNP,   CBG:  Recent Labs Lab 06/25/14 2211  Conner*     Microbiology: Results for orders placed during the hospital encounter of 09/09/09  URINE CULTURE     Status: None   Collection Time    09/09/09  2:46 PM      Result Value Ref Range Status   Specimen Description URINE, CLEAN CATCH   Final   Special Requests NONE   Final   Colony Count NO GROWTH   Final   Culture NO GROWTH   Final   Report Status 09/11/2009 FINAL   Final    Coagulation Studies:  Recent Labs  06/25/14 2147  LABPROT 27.5*  INR 2.56*    Urinalysis: No results found for this basename: COLORURINE, APPERANCEUR, LABSPEC, PHURINE, GLUCOSEU, HGBUR, BILIRUBINUR, KETONESUR, PROTEINUR, UROBILINOGEN, NITRITE, LEUKOCYTESUR,  in the last 168 hours  Lipid Panel: No results found for this basename: chol,  trig,  hdl,  cholhdl,  vldl,  ldlcalc  HgbA1C:  Lab Results  Component Value Date   HGBA1C  Value: 6.5 (NOTE) The ADA recommends the following therapeutic goal for glycemic control related to Hgb A1c measurement: Goal of therapy: <6.5 Hgb A1c  Reference: American Diabetes Association: Clinical Practice Recommendations 2010, Diabetes Care, 2010, 33: (Suppl  1).* 09/10/2009    Urine Drug Screen:   No results found for this basename: labopia,  cocainscrnur,  labbenz,  amphetmu,  thcu,  labbarb    Alcohol Level:   Recent Labs Lab 06/25/14 2147  ETH 97*    Other results: EKG: atrial fibrillation, rate 70 bpm.  Imaging: Ct Head Wo Contrast  06/25/2014   CLINICAL DATA:  Code stroke. Weakness in the left leg noted around 5 p.m. Patient fell. Shortness of breath. Diaphoretic. Weakness. Left-sided facial paralysis.  EXAM: CT HEAD WITHOUT CONTRAST  TECHNIQUE: Contiguous axial images were obtained from the base of the skull through the vertex without intravenous contrast.  COMPARISON:  None.  FINDINGS: There is significant central and cortical atrophy. Periventricular white matter changes are consistent with small vessel disease. There is no intra or extra-axial fluid  collection or mass lesion. The basilar cisterns and ventricles have a normal appearance. There is no CT evidence for acute infarction or hemorrhage. No calvarial fracture. There is atherosclerotic calcification of the internal carotid arteries.  IMPRESSION: 1.  No evidence for acute intracranial abnormality. 2. Atrophy and small vessel disease. 3. Critical Value/emergent results were called by telephone at the time of interpretation on 06/25/2014 at 10:20 pm to Dr. Doy Mince, who verbally acknowledged these results.   Electronically Signed   By: Shon Hale M.D.   On: 06/25/2014 22:23    Assessment: 78 y.o. male with a history of atrial fibrillation adequately anticoagulated on Coumadin who presents with left sided weakness and diplopia.  Patient with multiple vascular risk factors.  Head CT reviewed and shows no acute changes.   Patient outside of the time window and on Coumadin with an elevated INR and therefore not a tPA candidate.  NIHSS low and therefore not a intervention candidate as well.    Stroke Risk Factors - atrial fibrillation and hypertension  Plan: 1. HgbA1c, fasting lipid panel 2. Repeat head CT in 2-3 days 3. PT consult, OT consult, Speech consult 4. Echocardiogram 5. Carotid dopplers 6. Prophylactic therapy-Continue Coumadin 7. Risk factor modification 8. Telemetry monitoring 9. Frequent neuro checks   Case discussed with Dr. Donavan Burnet, MD [facility] [phone] 06/26/2014, 1:29 AM

## 2014-06-25 NOTE — ED Provider Notes (Signed)
CSN: [id number]     Arrival date & time 06/25/14  2144 History   First MD Initiated Contact with Patient 06/25/14 2151     Chief Complaint  Patient presents with  . Code Stroke    Patient is a 78 y.o. male presenting with Acute Neurological Problem. The history is provided by the patient.  Cerebrovascular Accident Associated symptoms include weakness. Pertinent negatives include no abdominal pain, chest pain, chills, coughing, fever, headaches, nausea, rash or vomiting.   PMHs for HTN, a fib on coumadin, pacemaker, OSA who presents for concerns of CVA per EMS. He reports falling out of bathtub at 1800 due to left sided weakness. Unable to get up due to left leg weakness. After event he had difficulty walking due to weakness.  At 2020, his wife noted left sided weakness and facial droop.  Pt did not strike his head but is anticoagulated. During fall, pt was diaphoretic and lightheaded. At current moment, he continues to note left sided weakness.  He admits to drinking 2 glasses of wine but this was after onset of sx.  Pt is a very poor historian and history is obtained from pt, EMS and wife. He denies chest pain, vision change, dyspnea, n/v/ fevers, neck pain, n/v.  Past Medical History  Diagnosis Date  . Hypertension   . Cancer   . Atrial fibrillation   . Pacemaker    Past Surgical History  Procedure Laterality Date  . Ablasion     History reviewed. No pertinent family history. History  Substance Use Topics  . Smoking status: Former Research scientist (life sciences)  . Smokeless tobacco: Not on file  . Alcohol Use: Yes     Comment: occasionally    Review of Systems  Constitutional: Negative for fever and chills.  Respiratory: Negative for cough, shortness of breath and wheezing.   Cardiovascular: Negative for chest pain.  Gastrointestinal: Negative for nausea, vomiting and abdominal pain.  Musculoskeletal: Negative for back pain.  Skin: Negative for rash.  Neurological: Positive for facial asymmetry,  weakness and light-headedness. Negative for dizziness, syncope, speech difficulty and headaches.  All other systems reviewed and are negative.     Allergies  Review of patient's allergies indicates no known allergies.  Home Medications   Prior to Admission medications   Medication Sig Start Date End Date Taking? Authorizing Provider  furosemide (LASIX) 20 MG tablet Take 60 mg by mouth daily.      Historical Provider, MD  glipiZIDE (GLUCOTROL XL) 2.5 MG 24 hr tablet Take 2.5 mg by mouth daily.      Historical Provider, MD  losartan (COZAAR) 50 MG tablet Take 50 mg by mouth every evening.      Historical Provider, MD  metoprolol (TOPROL-XL) 50 MG 24 hr tablet Take 50 mg by mouth daily.      Historical Provider, MD  omega-3 acid ethyl esters (LOVAZA) 1 G capsule Take 2 g by mouth 2 (two) times daily.      Historical Provider, MD  rosuvastatin (CRESTOR) 10 MG tablet Take 10 mg by mouth daily.      Historical Provider, MD  testosterone cypionate (DEPO-TESTOSTERONE) 200 MG/ML injection Inject 200 mg into the muscle every 14 (fourteen) days.      Historical Provider, MD  tetracycline (ACHROMYCIN,SUMYCIN) 250 MG capsule Take 250 mg by mouth daily.      Historical Provider, MD  warfarin (COUMADIN) 5 MG tablet Take 5 mg by mouth daily.     Historical Provider, MD  zolpidem (AMBIEN) 10  MG tablet Take 10 mg by mouth at bedtime.      Historical Provider, MD   SpO2 98% Physical Exam  Nursing note and vitals reviewed. Constitutional: He is oriented to person, place, and time. He appears well-developed and well-nourished.  HENT:  Head: Normocephalic and atraumatic.  Nose: Nose normal.  Eyes: Conjunctivae are normal. Pupils are equal, round, and reactive to light.  Neck: Normal range of motion. Neck supple. No tracheal deviation present.  Cardiovascular: Normal rate, regular rhythm and normal heart sounds.   No murmur heard. Pulmonary/Chest: Effort normal and breath sounds normal. No respiratory  distress. He has no rales.  Abdominal: Soft. Bowel sounds are normal. He exhibits no distension and no mass. There is no tenderness.  Musculoskeletal: Normal range of motion. He exhibits no edema.  Neurological: He is alert and oriented to person, place, and time.  Alert and oriented x3. CN 3-12 tested and without deficit with exception of decreased right facial sensation. 5/5 muscle strength in all extremities with flexion and extension (with exception of 4/5 left lower extremity and 4+/5 left upper extremity.+slurred speech. Normal bulk and tone.  No sensory deficit to light touch.  No pronator drift.  Normal heel-to-shin and finger-to-nose.  Toes flexor bilaterally.    Skin: Skin is warm and dry. No rash noted.    ED Course  Procedures (including critical care time) Labs Review Labs Reviewed  ETHANOL - Abnormal; Notable for the following:    Alcohol, Ethyl (B) 97 (*)    All other components within normal limits  PROTIME-INR - Abnormal; Notable for the following:    Prothrombin Time 27.5 (*)    INR 2.56 (*)    All other components within normal limits  CBC - Abnormal; Notable for the following:    RBC 3.70 (*)    Hemoglobin 12.5 (*)    HCT 35.3 (*)    All other components within normal limits  COMPREHENSIVE METABOLIC PANEL - Abnormal; Notable for the following:    Glucose, Bld 225 (*)    BUN 48 (*)    Creatinine, Ser 2.25 (*)    GFR calc non Af Amer 27 (*)    GFR calc Af Amer 31 (*)    Anion gap 23 (*)    All other components within normal limits  I-STAT CHEM 8, ED - Abnormal; Notable for the following:    BUN 46 (*)    Creatinine, Ser 2.60 (*)    Glucose, Bld 224 (*)    Hemoglobin 12.6 (*)    HCT 37.0 (*)    All other components within normal limits  CBG MONITORING, ED - Abnormal; Notable for the following:    Glucose-Capillary 242 (*)    All other components within normal limits  APTT  DIFFERENTIAL  URINE RAPID DRUG SCREEN (HOSP PERFORMED)  URINALYSIS, ROUTINE W  REFLEX MICROSCOPIC  I-STAT TROPOININ, ED  I-STAT TROPOININ, ED    Imaging Review Ct Head Wo Contrast  06/25/2014   CLINICAL DATA:  Code stroke. Weakness in the left leg noted around 5 p.m. Patient fell. Shortness of breath. Diaphoretic. Weakness. Left-sided facial paralysis.  EXAM: CT HEAD WITHOUT CONTRAST  TECHNIQUE: Contiguous axial images were obtained from the base of the skull through the vertex without intravenous contrast.  COMPARISON:  None.  FINDINGS: There is significant central and cortical atrophy. Periventricular white matter changes are consistent with small vessel disease. There is no intra or extra-axial fluid collection or mass lesion. The basilar cisterns and ventricles  have a normal appearance. There is no CT evidence for acute infarction or hemorrhage. No calvarial fracture. There is atherosclerotic calcification of the internal carotid arteries.  IMPRESSION: 1.  No evidence for acute intracranial abnormality. 2. Atrophy and small vessel disease. 3. Critical Value/emergent results were called by telephone at the time of interpretation on 06/25/2014 at 10:20 pm to Dr. Doy Mince, who verbally acknowledged these results.   Electronically Signed   By: Shon Hale M.D.   On: 06/25/2014 22:23     EKG Interpretation None      MDM   Final diagnoses:  AKI (acute kidney injury)  Cerebral infarction due to unspecified mechanism    Presents as code stroke. With left sided weakness. CBG wnl. NIHHS 4.  Co managed with neurology. CT head negative. Outisde window for tPA and anticoagulated anyways. INR elevated. PT endorses ETOH use which may be contributory for current presentation.  Pt is a very poor historian.   Admit to hospitalist.     Tammy Sours, MD 06/26/14 1030

## 2014-06-25 NOTE — ED Notes (Signed)
Pt reports sudden headache behind right eye; pain 7/10, described as throbbing pain

## 2014-06-25 NOTE — ED Notes (Signed)
Pt to ED from home via [facility] c/o L sided facial droop and L sided weakness. Pt reports falling out of the bathtub about 1800, denies injury from fall. At approximately 2020, pt felt SOB and increased weakness. EMS activated Code Stroke for L sided weakness and L sided facial droop. Pt reports two glasses of wine for dinner that is normal for him.

## 2014-06-25 NOTE — Code Documentation (Signed)
78 yo wm brought in via [facility] for SOB, Lt side weakness, & facial droop.  Per pt he fell out of the bathtub around 1700 and noticed LLE weakness.  He did not tell his wife and at 2020 she noticed him to be SOB and have mild facial droop.  NIH 4 for LLE weakness, facial droop, & slight dysarthria.  Pt admits to drinking 2 glasses of wine tonight & then beginning to feel weak.  No acute treatment at this time due to outside tPA window.

## 2014-06-25 NOTE — ED Notes (Signed)
Hospitalist at bedside 

## 2014-06-26 ENCOUNTER — Inpatient Hospital Stay (HOSPITAL_COMMUNITY): Payer: Medicare Other

## 2014-06-26 ENCOUNTER — Encounter (HOSPITAL_COMMUNITY): Payer: Self-pay | Admitting: Internal Medicine

## 2014-06-26 DIAGNOSIS — N183 Chronic kidney disease, stage 3 unspecified: Secondary | ICD-10-CM

## 2014-06-26 DIAGNOSIS — K627 Radiation proctitis: Secondary | ICD-10-CM | POA: Diagnosis present

## 2014-06-26 DIAGNOSIS — N189 Chronic kidney disease, unspecified: Secondary | ICD-10-CM

## 2014-06-26 DIAGNOSIS — G8194 Hemiplegia, unspecified affecting left nondominant side: Secondary | ICD-10-CM | POA: Diagnosis present

## 2014-06-26 DIAGNOSIS — Z8546 Personal history of malignant neoplasm of prostate: Secondary | ICD-10-CM

## 2014-06-26 DIAGNOSIS — D696 Thrombocytopenia, unspecified: Secondary | ICD-10-CM

## 2014-06-26 DIAGNOSIS — G4733 Obstructive sleep apnea (adult) (pediatric): Secondary | ICD-10-CM | POA: Diagnosis present

## 2014-06-26 DIAGNOSIS — Z950 Presence of cardiac pacemaker: Secondary | ICD-10-CM

## 2014-06-26 DIAGNOSIS — Z95810 Presence of automatic (implantable) cardiac defibrillator: Secondary | ICD-10-CM

## 2014-06-26 DIAGNOSIS — Z7901 Long term (current) use of anticoagulants: Secondary | ICD-10-CM

## 2014-06-26 DIAGNOSIS — K7581 Nonalcoholic steatohepatitis (NASH): Secondary | ICD-10-CM

## 2014-06-26 DIAGNOSIS — E1129 Type 2 diabetes mellitus with other diabetic kidney complication: Secondary | ICD-10-CM

## 2014-06-26 DIAGNOSIS — I369 Nonrheumatic tricuspid valve disorder, unspecified: Secondary | ICD-10-CM

## 2014-06-26 DIAGNOSIS — G8190 Hemiplegia, unspecified affecting unspecified side: Secondary | ICD-10-CM

## 2014-06-26 HISTORY — DX: Obstructive sleep apnea (adult) (pediatric): G47.33

## 2014-06-26 HISTORY — DX: Thrombocytopenia, unspecified: D69.6

## 2014-06-26 HISTORY — DX: Presence of automatic (implantable) cardiac defibrillator: Z95.810

## 2014-06-26 HISTORY — DX: Presence of cardiac pacemaker: Z95.0

## 2014-06-26 HISTORY — DX: Nonalcoholic steatohepatitis (NASH): K75.81

## 2014-06-26 HISTORY — DX: Chronic kidney disease, stage 3 unspecified: N18.30

## 2014-06-26 HISTORY — DX: Personal history of malignant neoplasm of prostate: Z85.46

## 2014-06-26 LAB — URINALYSIS, ROUTINE W REFLEX MICROSCOPIC
Bilirubin Urine: NEGATIVE
Bilirubin Urine: NEGATIVE
Glucose, UA: NEGATIVE mg/dL
Glucose, UA: NEGATIVE mg/dL
Ketones, ur: NEGATIVE mg/dL
Ketones, ur: NEGATIVE mg/dL
Nitrite: NEGATIVE
Nitrite: NEGATIVE
Protein, ur: NEGATIVE mg/dL
Protein, ur: 100 mg/dL — AB
SPECIFIC GRAVITY, URINE: 1.019 (ref 1.005–1.030)
Specific Gravity, Urine: 1.013 (ref 1.005–1.030)
Urobilinogen, UA: 0.2 mg/dL (ref 0.0–1.0)
Urobilinogen, UA: 0.2 mg/dL (ref 0.0–1.0)
pH: 6 (ref 5.0–8.0)
pH: 5.5 (ref 5.0–8.0)

## 2014-06-26 LAB — TROPONIN I: Troponin I: <0.3 ng/mL (ref <0.30)

## 2014-06-26 LAB — CBC WITH DIFFERENTIAL/PLATELET
BASOS ABS: 0 10*3/uL (ref 0.0–0.1)
BASOS PCT: 0 % (ref 0–1)
Eosinophils Absolute: 0.1 10*3/uL (ref 0.0–0.7)
Eosinophils Relative: 3 % (ref 0–5)
HCT: 32.3 % — ABNORMAL LOW (ref 39.0–52.0)
HEMOGLOBIN: 11.3 g/dL — AB (ref 13.0–17.0)
Lymphocytes Relative: 27 % (ref 12–46)
Lymphs Abs: 1.3 10*3/uL (ref 0.7–4.0)
MCH: 33.3 pg (ref 26.0–34.0)
MCHC: 35 g/dL (ref 30.0–36.0)
MCV: 95.3 fL (ref 78.0–100.0)
Monocytes Absolute: 0.3 10*3/uL (ref 0.1–1.0)
Monocytes Relative: 7 % (ref 3–12)
NEUTROS ABS: 3 10*3/uL (ref 1.7–7.7)
Neutrophils Relative %: 63 % (ref 43–77)
Platelets: 75 10*3/uL — ABNORMAL LOW (ref 150–400)
RBC: 3.39 MIL/uL — ABNORMAL LOW (ref 4.22–5.81)
RDW: 13.8 % (ref 11.5–15.5)
WBC: 4.7 10*3/uL (ref 4.0–10.5)

## 2014-06-26 LAB — BASIC METABOLIC PANEL
Anion gap: 15 (ref 5–15)
BUN: 52 mg/dL — ABNORMAL HIGH (ref 6–23)
CALCIUM: 9.5 mg/dL (ref 8.4–10.5)
CO2: 26 mEq/L (ref 19–32)
CREATININE: 1.86 mg/dL — AB (ref 0.50–1.35)
Chloride: 99 mEq/L (ref 96–112)
GFR calc Af Amer: 39 mL/min — ABNORMAL LOW (ref >90)
GFR, EST NON AFRICAN AMERICAN: 34 mL/min — AB (ref >90)
GLUCOSE: 281 mg/dL — AB (ref 70–99)
Potassium: 4.5 mEq/L (ref 3.7–5.3)
Sodium: 140 mEq/L (ref 137–147)

## 2014-06-26 LAB — RAPID URINE DRUG SCREEN, HOSP PERFORMED
Amphetamines: NOT DETECTED
Amphetamines: NOT DETECTED
Barbiturates: NOT DETECTED
Barbiturates: NOT DETECTED
Benzodiazepines: NOT DETECTED
Benzodiazepines: NOT DETECTED
COCAINE: NOT DETECTED
Cocaine: NOT DETECTED
OPIATES: NOT DETECTED
Opiates: NOT DETECTED
TETRAHYDROCANNABINOL: NOT DETECTED
Tetrahydrocannabinol: NOT DETECTED

## 2014-06-26 LAB — T4, FREE: Free T4: 1.22 ng/dL (ref 0.80–1.80)

## 2014-06-26 LAB — URINE MICROSCOPIC-ADD ON

## 2014-06-26 LAB — LIPID PANEL
Cholesterol: 157 mg/dL (ref 0–200)
HDL: 37 mg/dL — ABNORMAL LOW (ref >39)
LDL CALC: 69 mg/dL (ref 0–99)
TRIGLYCERIDES: 257 mg/dL — AB (ref <150)
Total CHOL/HDL Ratio: 4.2 RATIO
VLDL: 51 mg/dL — ABNORMAL HIGH (ref 0–40)

## 2014-06-26 LAB — HEMOGLOBIN A1C
Hgb A1c MFr Bld: 6.5 % — ABNORMAL HIGH (ref <5.7)
Mean Plasma Glucose: 140 mg/dL — ABNORMAL HIGH (ref <117)

## 2014-06-26 LAB — GLUCOSE, CAPILLARY
Glucose-Capillary: 272 mg/dL — ABNORMAL HIGH (ref 70–99)
Glucose-Capillary: 145 mg/dL — ABNORMAL HIGH (ref 70–99)

## 2014-06-26 LAB — TSH: TSH: 1.14 u[IU]/mL (ref 0.350–4.500)

## 2014-06-26 LAB — VITAMIN B12: Vitamin B-12: 449 pg/mL (ref 211–911)

## 2014-06-26 MED ORDER — DEXTROSE 5 % IV SOLN
1.0000 g | INTRAVENOUS | Status: DC
  Administered 2014-06-26 – 2014-06-27 (×2): 1 g via INTRAVENOUS
  Filled 2014-06-26 (×2): qty 10

## 2014-06-26 MED ORDER — ZOLPIDEM TARTRATE 5 MG PO TABS
5.0000 mg | ORAL_TABLET | Freq: Every day | ORAL | Status: DC
  Administered 2014-06-26 (×2): 5 mg via ORAL
  Filled 2014-06-26 (×2): qty 1

## 2014-06-26 MED ORDER — WARFARIN SODIUM 6 MG PO TABS
6.0000 mg | ORAL_TABLET | Freq: Every day | ORAL | Status: DC
  Administered 2014-06-26 (×2): 6 mg via ORAL
  Filled 2014-06-26 (×3): qty 1

## 2014-06-26 MED ORDER — ATORVASTATIN CALCIUM 10 MG PO TABS
10.0000 mg | ORAL_TABLET | Freq: Every day | ORAL | Status: DC
  Administered 2014-06-26: 10 mg via ORAL
  Filled 2014-06-26: qty 1

## 2014-06-26 MED ORDER — INSULIN ASPART 100 UNIT/ML ~~LOC~~ SOLN: 0.0000 [IU] | Freq: Every day | SUBCUTANEOUS | Status: DC

## 2014-06-26 MED ORDER — LOSARTAN POTASSIUM 50 MG PO TABS: 50.0000 mg | ORAL_TABLET | Freq: Every evening | ORAL | Status: DC

## 2014-06-26 MED ORDER — ONDANSETRON HCL 4 MG/2ML IJ SOLN: 4.0000 mg | Freq: Four times a day (QID) | INTRAMUSCULAR | Status: DC | PRN

## 2014-06-26 MED ORDER — OMEGA-3-ACID ETHYL ESTERS 1 G PO CAPS
2.0000 g | ORAL_CAPSULE | Freq: Two times a day (BID) | ORAL | Status: DC
  Administered 2014-06-26 – 2014-06-27 (×4): 2 g via ORAL
  Filled 2014-06-26 (×4): qty 2

## 2014-06-26 MED ORDER — SERTRALINE HCL 50 MG PO TABS
50.0000 mg | ORAL_TABLET | Freq: Every day | ORAL | Status: DC
  Administered 2014-06-26 (×2): 50 mg via ORAL
  Filled 2014-06-26 (×2): qty 1

## 2014-06-26 MED ORDER — HYDRALAZINE HCL 25 MG PO TABS
25.0000 mg | ORAL_TABLET | Freq: Two times a day (BID) | ORAL | Status: DC
  Administered 2014-06-26 – 2014-06-27 (×4): 25 mg via ORAL
  Filled 2014-06-26 (×4): qty 1

## 2014-06-26 MED ORDER — METOPROLOL SUCCINATE ER 25 MG PO TB24
50.0000 mg | ORAL_TABLET | Freq: Every day | ORAL | Status: DC
  Administered 2014-06-26 – 2014-06-27 (×2): 50 mg via ORAL
  Filled 2014-06-26 (×2): qty 2

## 2014-06-26 MED ORDER — ONDANSETRON HCL 4 MG PO TABS: 4.0000 mg | ORAL_TABLET | Freq: Four times a day (QID) | ORAL | Status: DC | PRN

## 2014-06-26 MED ORDER — WARFARIN SODIUM 6 MG PO TABS: 6.0000 mg | ORAL_TABLET | Freq: Every evening | ORAL | Status: DC

## 2014-06-26 MED ORDER — SODIUM CHLORIDE 0.9 % IV SOLN
Freq: Once | INTRAVENOUS | Status: DC
  Administered 2014-06-26: 01:00:00 via INTRAVENOUS
  Filled 2014-06-26: qty 1000

## 2014-06-26 MED ORDER — GLIPIZIDE ER 2.5 MG PO TB24
2.5000 mg | ORAL_TABLET | Freq: Every day | ORAL | Status: DC
  Filled 2014-06-26: qty 1

## 2014-06-26 MED ORDER — INSULIN ASPART 100 UNIT/ML ~~LOC~~ SOLN
0.0000 [IU] | Freq: Three times a day (TID) | SUBCUTANEOUS | Status: DC
  Administered 2014-06-26: 1 [IU] via SUBCUTANEOUS
  Administered 2014-06-26: 5 [IU] via SUBCUTANEOUS
  Administered 2014-06-27 (×2): 2 [IU] via SUBCUTANEOUS

## 2014-06-26 MED ORDER — WARFARIN - PHARMACIST DOSING INPATIENT: Freq: Every day | Status: DC

## 2014-06-26 MED ORDER — BISACODYL 10 MG RE SUPP: 10.0000 mg | Freq: Every day | RECTAL | Status: DC | PRN

## 2014-06-26 NOTE — Progress Notes (Signed)
VASCULAR LAB PRELIMINARY  PRELIMINARY  PRELIMINARY  PRELIMINARY  Carotid Dopplers completed.    Preliminary report:  1-39% ICA stenosis.  Vertebral artery flow is antegrade.   Tniya Bowditch, RVT 06/26/2014, 11:28 AM

## 2014-06-26 NOTE — ED Notes (Signed)
Informed Joy RN on 4N regarding changes prior to previous report.

## 2014-06-26 NOTE — Progress Notes (Signed)
Admitted to floor from ED. Explained plan of care and also placed patient on telemetry, explained call system to patient and room information. Gave opportunity to answer questions pertaining to care. Will continue to monitor accordingly.

## 2014-06-26 NOTE — Progress Notes (Signed)
Patient c/o of slight chest discomfort at 3/10 with twitches in left arm and leg. Patient has stent and pacemaker/defibrillator in place. Patient skin is dry and warm, VSS, no SOB noted, and patient appears to be at rest. Retail banker PA. Will continue to monitor accordingly.

## 2014-06-26 NOTE — Progress Notes (Signed)
  Echocardiogram 2D Echocardiogram has been performed.  Cameron Acosta 06/26/2014, 10:27 AM

## 2014-06-26 NOTE — ED Notes (Signed)
Dr.Jegede requesting IV fluids with potassium be started prior to transfer to the floor

## 2014-06-26 NOTE — ED Notes (Addendum)
Pt reports feeling "increased heart rate" denies chest pain. PVCs noted on ekg rhythm every third beat. Dr.Jegede paged to inform; repeat ekg completed

## 2014-06-26 NOTE — ED Notes (Signed)
Dr.Jegede aware of change in ekg

## 2014-06-26 NOTE — Evaluation (Signed)
Physical Therapy Evaluation Patient Details Name: Cameron Acosta MRN: [id number] DOB: [birth year] Today's Date: 06/26/2014   History of Present Illness  Cameron Acosta is a 78 y.o. male pleasant man with history of prostate cancer status post radiation with consequent urethral stricture, self-catheterization at home, hypertension, atrial fibrillation status post pacemaker insertion, severe obstructive sleep apnea on home CPAP was in his usual state of health until sometimes this afternoon when he started feeling of easy, had the urge to defecate, could not make it to the bathroom, became sweaty and dizzy. He fell in the bathtub sometimes in the morning a few hours before his symptoms started, he reported to have hit his head on the wall, no immediate loss of consciousness.  Clinical Impression  Pt with noted L sided weakness however remains able to ambulate without AD. Pt is unsteady and demo's increased falls risk. Pt reports "I have no balance." Recommend outpt PT to address mentioned deficits.    Follow Up Recommendations Home health PT;Supervision/Assistance - 24 hour    Equipment Recommendations  None recommended by PT    Recommendations for Other Services       Precautions / Restrictions Precautions Precautions: Fall Restrictions Weight Bearing Restrictions: No      Mobility  Bed Mobility Overal bed mobility:  (pt received sitting up in chair)                Transfers Overall transfer level: Needs assistance Equipment used: None Transfers: Sit to/from Stand Sit to Stand: Supervision         General transfer comment: pt with good technique, mild increase in time  Ambulation/Gait Ambulation/Gait assistance: Min guard Ambulation Distance (Feet): 150 Feet Assistive device: None Gait Pattern/deviations: Step-through pattern;Antalgic   Gait velocity interpretation: Below normal speed for age/gender General Gait Details: + SOB, SpO2 at 100% on RA, pt with c/o  sore L hip and knee hence the mild limp. no episodes of LOB however mildly unsteady, pt deferred use of RW  Stairs Stairs: Yes Stairs assistance: Min assist Stair Management: No rails;Step to pattern Number of Stairs: 5 General stair comments: v/c's to ascend with R LE (strong), decend with the L (weaker leg). pt cautious without railings  Wheelchair Mobility    Modified Rankin (Stroke Patients Only) Modified Rankin (Stroke Patients Only) Pre-Morbid Rankin Score: No significant disability Modified Rankin: Moderate disability     Balance Overall balance assessment: Needs assistance         Standing balance support: No upper extremity supported Standing balance-Leahy Scale: Fair                               Pertinent Vitals/Pain Pain Assessment: No/denies pain    Home Living Family/patient expects to be discharged to:: Private residence Living Arrangements: Spouse/significant other Available Help at Discharge: Family;Available 24 hours/day Type of Home: House Home Access: Stairs to enter Entrance Stairs-Rails: None Entrance Stairs-Number of Steps: 3-5 Home Layout: Two level;Able to live on main level with bedroom/bathroom Home Equipment: None      Prior Function Level of Independence: Independent               Hand Dominance   Dominant Hand: Right    Extremity/Trunk Assessment   Upper Extremity Assessment: LUE deficits/detail       LUE Deficits / Details: grossly 4-/5   Lower Extremity Assessment: LLE deficits/detail   LLE Deficits / Details: grossly 4-/5  Cervical / Trunk  Assessment: Normal  Communication   Communication: No difficulties  Cognition Arousal/Alertness: Awake/alert Behavior During Therapy: WFL for tasks assessed/performed Overall Cognitive Status: Within Functional Limits for tasks assessed                      General Comments      Exercises        Assessment/Plan    PT Assessment Patient needs  continued PT services  PT Diagnosis Difficulty walking;Generalized weakness   PT Problem List Decreased strength;Decreased activity tolerance;Decreased balance;Decreased mobility  PT Treatment Interventions DME instruction;Gait training;Stair training;Functional mobility training;Therapeutic activities;Therapeutic exercise;Balance training   PT Goals (Current goals can be found in the Care Plan section) Acute Rehab PT Goals Patient Stated Goal: home PT Goal Formulation: With patient Time For Goal Achievement: 07/03/14 Potential to Achieve Goals: Good    Frequency Min 4X/week   Barriers to discharge        Co-evaluation               End of Session Equipment Utilized During Treatment: Gait belt Activity Tolerance: Patient tolerated treatment well Patient left: in chair;with call bell/phone within reach Nurse Communication: Mobility status         Time: 1515-1540 PT Time Calculation (min): 25 min   Charges:   PT Evaluation $Initial PT Evaluation Tier I: 1 Procedure PT Treatments $Gait Training: 8-22 mins   PT G CodesKingsley Callander 06/26/2014, 4:28 PM  Kittie Plater, PT, DPT Pager #: [phone] Office #: [phone]

## 2014-06-26 NOTE — Progress Notes (Addendum)
ANTICOAGULATION CONSULT NOTE - Initial Consult  Pharmacy Consult for coumadin  Indication: atrial fibrillation/stroke  No Known Allergies  Patient Measurements:   Heparin Dosing Weight:   Vital Signs: Temp: 98.5 F (36.9 C) (09/13 2339) Temp src: Oral (09/13 2205) BP: 141/72 mmHg (09/14 0000) Pulse Rate: 69 (09/14 0000)  Labs:  Recent Labs  06/25/14 2147 06/25/14 2153  HGB 12.5* 12.6*  HCT 35.3* 37.0*  PLT 95*  --   APTT 34  --   LABPROT 27.5*  --   INR 2.56*  --   CREATININE 2.25* 2.60*    CrCl is unknown because there is no height on file for the current visit.   Medical History: Past Medical History  Diagnosis Date  . Hypertension   . Cancer   . Atrial fibrillation   . Pacemaker     Medications:   (Not in a hospital admission)  Assessment: 78 yo with hx afib and therapeutic inr of 2.56. Had a fall earlier this evening with subsequent stroke sx's NIH score 4. Head CT negative. Coumadin to continue per neurology. F/u ct head in 2-3 days.  Goal of Therapy:  INR 2-3 Monitor platelets by anticoagulation protocol: Yes   Plan:  Continue coumadin 6 mg daily. Begin now. Last dose recorded as 9/12.  Daily INR   Curlene Dolphin 06/26/2014,12:49 AM

## 2014-06-26 NOTE — ED Notes (Signed)
Informed 4N of delay in transfer to the floor

## 2014-06-26 NOTE — Progress Notes (Signed)
Pt has home cpap and places self on and off.

## 2014-06-26 NOTE — Progress Notes (Signed)
[facility] PROGRESS NOTE  Newton Frutiger TKP:[id number] DOB: 1936/05/09 DOA: 06/25/2014  PCP: Liliane Shi, MD  Brief HPI: 78 year old with PMH as below who sees physicians at [facility] presented after fall due to sudden left sided weakness.  Past medical history:  Past Medical History  Diagnosis Date  . Hypertension   . Cancer   . Atrial fibrillation   . Pacemaker   . History of prostate cancer 06/26/2014  . AICD (automatic cardioverter/defibrillator) present 06/26/2014  . Cardiac pacemaker in situ 06/26/2014  . CKD (chronic kidney disease) stage 3, GFR 30-59 ml/min 06/26/2014  . OSA (obstructive sleep apnea) 06/26/2014  . NASH (nonalcoholic steatohepatitis) 06/26/2014  . Thrombocytopenia 06/26/2014    Consultants: Neurology  Procedures:  2D ECHO and carotid dopplers pending  Antibiotics: Ceftriaxone 9/14-->  Subjective: Patient continues to have weakness in left side. Denies difficulty swallowing. Has been having difficulty with urination and has seen providers at [facility] for same. Self catheterizes at home.  Objective: Vital Signs  Filed Vitals:   06/26/14 0130 06/26/14 0154 06/26/14 0400 06/26/14 0600  BP: 135/83 143/72 109/56 107/62  Pulse: 82 70 72 70  Temp:  98.4 F (36.9 C)  98.6 F (37 C)  TempSrc:  Oral  Oral  Resp: 14 17 16 18   SpO2: 94% 100% 99% 99%   No intake or output data in the 24 hours ending 06/26/14 0951 There were no vitals filed for this visit.   General appearance: alert, cooperative, appears stated age and no distress Resp: clear to auscultation bilaterally Cardio: regular rate and rhythm, S1, S2 normal, no murmur, click, rub or gallop GI: soft, non-tender; bowel sounds normal; no masses,  no organomegaly Extremities: extremities normal, atraumatic, no cyanosis or edema Neurologic: No facial droop. Left sided weakness noted.  Lab Results:  Basic Metabolic Panel:  Recent Labs Lab 06/25/14 2147 06/25/14 2153    NA 143 140  K 4.2 3.9  CL 97 102  CO2 23  --   GLUCOSE 225* 224*  BUN 48* 46*  CREATININE 2.25* 2.60*  CALCIUM 9.5  --    Liver Function Tests:  Recent Labs Lab 06/25/14 2147  AST 30  ALT 28  ALKPHOS 60  BILITOT 0.7  PROT 7.3  ALBUMIN 3.6   CBC:  Recent Labs Lab 06/25/14 2147 06/25/14 2153 06/26/14 0822  WBC 6.2  --  4.7  NEUTROABS 4.0  --  3.0  HGB 12.5* 12.6* 11.3*  HCT 35.3* 37.0* 32.3*  MCV 95.4  --  95.3  PLT 95*  --  75*   Cardiac Enzymes:  Recent Labs Lab 06/26/14 0139 06/26/14 0822  TROPONINI <0.30 <0.30   CBG:  Recent Labs Lab 06/25/14 2211  GLUCAP 242*    Studies/Results: Ct Head Wo Contrast  06/25/2014   CLINICAL DATA:  Code stroke. Weakness in the left leg noted around 5 p.m. Patient fell. Shortness of breath. Diaphoretic. Weakness. Left-sided facial paralysis.  EXAM: CT HEAD WITHOUT CONTRAST  TECHNIQUE: Contiguous axial images were obtained from the base of the skull through the vertex without intravenous contrast.  COMPARISON:  None.  FINDINGS: There is significant central and cortical atrophy. Periventricular white matter changes are consistent with small vessel disease. There is no intra or extra-axial fluid collection or mass lesion. The basilar cisterns and ventricles have a normal appearance. There is no CT evidence for acute infarction or hemorrhage. No calvarial fracture. There is atherosclerotic calcification of the internal carotid arteries.  IMPRESSION: 1.  No  evidence for acute intracranial abnormality. 2. Atrophy and small vessel disease. 3. Critical Value/emergent results were called by telephone at the time of interpretation on 06/25/2014 at 10:20 pm to Dr. Doy Mince, who verbally acknowledged these results.   Electronically Signed   By: Shon Hale M.D.   On: 06/25/2014 22:23    Medications:  Scheduled: . atorvastatin  10 mg Oral q1800  . cefTRIAXone (ROCEPHIN)  IV  1 g Intravenous Q24H  . glipiZIDE  2.5 mg Oral Daily  .  hydrALAZINE  25 mg Oral BID  . [START ON 06/27/2014] losartan  50 mg Oral QPM  . metoprolol succinate  50 mg Oral Daily  . omega-3 acid ethyl esters  2 g Oral BID  . sertraline  50 mg Oral QHS  . sodium chloride 0.9 % 1,000 mL with potassium chloride 20 mEq infusion   Intravenous Once  . warfarin  6 mg Oral q1800  . Warfarin - Pharmacist Dosing Inpatient   Does not apply q1800  . zolpidem  5 mg Oral QHS   Continuous:  OHY:WVPXTGGYI, HYDROcodone-acetaminophen, ondansetron (ZOFRAN) IV, ondansetron  Assessment/Plan:  Principal Problem:   Left hemiparesis Active Problems:   Acute renal failure   Dehydration   Atrial fibrillation   Cardiac pacemaker in situ   AICD (automatic cardioverter/defibrillator) present   History of prostate cancer   Thrombocytopenia   CKD (chronic kidney disease) stage 3, GFR 30-59 ml/min   Long term (current) use of anticoagulants   OSA (obstructive sleep apnea)   NASH (nonalcoholic steatohepatitis)   Radiation proctitis   Left hemiparesis Concerning for stroke. Neurology following. Stroke w/u in progress. Cannot have MRI due to PM/AICD. On warfarin. Lipid panel. HBA1c. PT/OT/SLP. Repeat CT in 2-3 days.  Acute on Chronic Renal Failure stage 3 Baseline creatinine around 1.6 but has been around 2 previously (careeverywhere). Gentle hydration provided. Repeat levels pending. Holding diuretics.  History of CAD and probable nonischemic cardiomyopathy (despite CAD) Follows with cardiology at [facility]. EF around 30-40%. Has AICD/PM. PVC's noted on tele. Trop negative. On BB. Previous stents. Stable per last cardiology notes (care-everywhere). Holding diuretics.  Chronic Atrial fibrillation On BB. PVC's. ECHo pending. On warfarin.   PVCs Previously noted by his cardiologist.   History of Prostate Ca s/p radiation  He is experiencing difficulty with urination and has seen providers at [facility]. Self catheterizes.   UTI Ceftriaxone. Urine culture.  Obstructive  sleep apnea CPAP.  Thrombocytopenia likely secondary to NASH Stable platelet counts compared to previous. No overt bleeding. LFT's normal.  DM2 with renal complications SSI. Hold oral agents. HBa1c  DVT Prophylaxis: On warfarin    Code Status: DNR per patient wishes  Family Communication: Discussed with patient  Disposition Plan: Work up in progress. Lives at home with wife.    LOS: 1 day   [facility] Pager [phone] 06/26/2014, 9:51 AM  If 8PM-8AM, please contact night-coverage at [email or web address], password [email or web address]   Disclaimer: This note was dictated with voice recognition software. Similar sounding words can inadvertently be transcribed and may not be corrected upon review.

## 2014-06-26 NOTE — Progress Notes (Signed)
STROKE TEAM PROGRESS NOTE   HISTORY HPI: Cameron Acosta is an 78 y.o. male who reports the when he went to take a bath this evening he was at baseline. When he attempted to get out of the tub his left leg was weak and he fell. He eventually was able to get up but remained weak. He did not make his wife aware. After eating dinner he attempted to go upstairs. He had difficulty but was able to make it. At 2020 his wife noted that he was having weakness on the left and EMS was called. Patient was brought in as a code stroke. Initial NIHSS of 4.  Date last known well: Date: 06/25/2014  Time last known well: Time: 17:00  tPA Given: No: Outside time window, On Coumadin with therapeutic INR   SUBJECTIVE (INTERVAL HISTORY) No family is at the bedside.  Overall he feels his condition is gradually improving. He stated that he had left LE weakness for more than a month, and he had left knee anthroscopic surgery one year ago. His left leg gave him away when he got out of tub yesterday and he fell. Later yesterday, he had lightheadedness, sweaty, generalized weakness, tried to go to bathroom to defecate but not able to get there on time and he had bowel incontinence with passing out episode, consistent with vasovagal syncope. He did not notice any left UE weakness. He had double vision without glasses, with either eye closed, he still sees double vision (b/l monocular diplopia).   OBJECTIVE Temp:  [98.3 F (36.8 C)-99.9 F (37.7 C)] 99 F (37.2 C) (09/14 1700) Pulse Rate:  [69-82] 69 (09/14 1700) Cardiac Rhythm:  [-] Ventricular paced (09/14 0827) Resp:  [14-18] 18 (09/14 1700) BP: (103-143)/(56-83) 129/72 mmHg (09/14 1700) SpO2:  [94 %-100 %] 100 % (09/14 1700)   Recent Labs Lab 06/25/14 2211 06/26/14 1255  GLUCAP 242* 272*    Recent Labs Lab 06/25/14 2147 06/25/14 2153 06/26/14 1335  NA 143 140 140  K 4.2 3.9 4.5  CL 97 102 99  CO2 23  --  26  GLUCOSE 225* 224* 281*  BUN 48* 46* 52*   CREATININE 2.25* 2.60* 1.86*  CALCIUM 9.5  --  9.5    Recent Labs Lab 06/25/14 2147  AST 30  ALT 28  ALKPHOS 60  BILITOT 0.7  PROT 7.3  ALBUMIN 3.6    Recent Labs Lab 06/25/14 2147 06/25/14 2153 06/26/14 0822  WBC 6.2  --  4.7  NEUTROABS 4.0  --  3.0  HGB 12.5* 12.6* 11.3*  HCT 35.3* 37.0* 32.3*  MCV 95.4  --  95.3  PLT 95*  --  75*    Recent Labs Lab 06/26/14 0139 06/26/14 0822 06/26/14 1335  TROPONINI <0.30 <0.30 <0.30    Recent Labs  06/25/14 2147  LABPROT 27.5*  INR 2.56*    Recent Labs  06/26/14 0213  COLORURINE YELLOW  LABSPEC 1.013  PHURINE 6.0  GLUCOSEU NEGATIVE  HGBUR SMALL*  BILIRUBINUR NEGATIVE  KETONESUR NEGATIVE  PROTEINUR NEGATIVE  UROBILINOGEN 0.2  NITRITE NEGATIVE  LEUKOCYTESUR MODERATE*       Component Value Date/Time   CHOL 157 06/26/2014 1132   TRIG 257* 06/26/2014 1132   HDL 37* 06/26/2014 1132   CHOLHDL 4.2 06/26/2014 1132   VLDL 51* 06/26/2014 1132   LDLCALC 69 06/26/2014 1132   Lab Results  Component Value Date   HGBA1C  Value: 6.5 (NOTE) The ADA recommends the following therapeutic goal for glycemic control  related to Hgb A1c measurement: Goal of therapy: <6.5 Hgb A1c  Reference: American Diabetes Association: Clinical Practice Recommendations 2010, Diabetes Care, 2010, 33: (Suppl  1).* 09/10/2009      Component Value Date/Time   LABOPIA NONE DETECTED 06/26/2014 0213   COCAINSCRNUR NONE DETECTED 06/26/2014 0213   LABBENZ NONE DETECTED 06/26/2014 0213   AMPHETMU NONE DETECTED 06/26/2014 0213   THCU NONE DETECTED 06/26/2014 0213   LABBARB NONE DETECTED 06/26/2014 0213     Recent Labs Lab 06/25/14 2147  ETH 97*    Ct Head Wo Contrast  06/25/2014   IMPRESSION: 1.  No evidence for acute intracranial abnormality. 2. Atrophy and small vessel disease.    Portable Chest 1 View  06/26/2014  IMPRESSION: 1. Increased indistinctness the left cardiac border suspicious for a lingular airspace opacity. Asymmetric edema and  pneumonia are differential diagnostic considerations. Followup chest radiography is recommended in 4 weeks time to ensure resolution and exclude underlying malignancy. 2. Moderately enlarged cardiopericardial silhouette with cephalization of blood flow compatible with pulmonary venous hypertension.    2D echo  - Left ventricle: The cavity size was normal. There was mild concentric hypertrophy. Systolic function was mildly to moderately reduced. The estimated ejection fraction was in the range of 40% to 45%. Global hypokinesis with more prounounced anterior hypokinesis. The study is not technically sufficient to allow evaluation of LV diastolic function. - Aortic valve: Mildly calcified leaflets. There was no stenosis. There was mild regurgitation. - Mitral valve: Calcified annulus. - Left atrium: Severely dilated (Area 40 cm2). - Right ventricle: The cavity size was mildly dilated. AICD wire noted in right ventricle. Systolic function was normal. - Right atrium: Severely dilated (28 cm2). AICD wire noted in right atrium. - Tricuspid valve: There was moderate regurgitation. - Pulmonary arteries: PA peak pressure: 29 mm Hg (S).  Impressions:  - LVEF 40-45%, global hypokinesis with more prounounced anterior hypokinesis, AICD wires in place, severe biatrial enlargement. No prior echo for comparison.  CUS - Bilateral: 1-39% ICA stenosis. Vertebral artery flow is antegrade.   PHYSICAL EXAM  Temp:  [98.3 F (36.8 C)-99.9 F (37.7 C)] 99 F (37.2 C) (09/14 1700) Pulse Rate:  [69-82] 69 (09/14 1700) Resp:  [14-18] 18 (09/14 1700) BP: (103-143)/(56-83) 129/72 mmHg (09/14 1700) SpO2:  [94 %-100 %] 100 % (09/14 1700)  General - Well nourished, well developed, in no apparent distress.  Ophthalmologic - not able to see through.  Cardiovascular - Irregularly irregular rate and rhythm.  Mental Status -  Level of arousal and orientation to time, place, and person were intact. Language  including expression, naming, repetition, comprehension was assessed and found intact. Fund of Knowledge was assessed and was intact.  Cranial Nerves II - XII - II - Visual field intact OU. III, IV, VI - Extraocular movements intact. V - Facial sensation intact bilaterally. VII - Facial movement intact bilaterally. VIII - Hearing & vestibular intact bilaterally. X - Palate elevates symmetrically. XI - Chin turning & shoulder shrug intact bilaterally. XII - Tongue protrusion intact.  Motor Strength - The patient's strength was 5/5 RUE and RLE, pronator drift on the left and 5-/5 LUE distally. 5-/5 LLE.  Bulk was normal and fasciculations were absent.   Motor Tone - Muscle tone was assessed at the neck and appendages and was normal.  Reflexes - The patient's reflexes were 1+ in all extremities and he had no pathological reflexes.  Sensory - Light touch, temperature/pinprick were assessed and were normal.  Coordination - The patient had normal movements in the hands and feet with no ataxia or dysmetria.  Tremor was absent.  Gait and Station - not tested due to safety concern.   ASSESSMENT/PLAN  Mr. Cameron Acosta is a 78 y.o. male with history of afib on coumadin with therapeutic INR, s/p pacemaker, prostate cancer s/p radiation with urethral stricture and self cath presenting with fall episode and later syncope event. He did not receive IV t-PA due to therapeutic INR. CT no acute abnormality.   He stated that he has left leg weakness for at least one month, he had left knee arthroscopic surgery about one year ago. His fall is attributed to left leg give away. Could be related to UTI and pneumonia, which could make weakness worse.  His later episode of lightheadedness, sweating, generalized weakness, urge for defecation, bowel incontinence and LOC, consistent with vasovagal syncope.   On exam, he also had mild LUE weakness, but he stated that he never noticed that. I am not sure if  this acute or also chronic. He is not myelopathic. LUE weakness could be also related to UTI and pneumonia, which make old weakness re-emerge.  2D echo showed EF 40-45% and CUS unremarkable. He is on coumadin and INR therapeutic. Would continue the coumadin and repeat CT head in am to rule out large stroke.  Questionable acute stroke - low suspicious    warfarin prior to admission, now on warfarin. Please continue coumadin for stroke prevention, INR 2-3 goal  CT negative for acute stroke. Recommend to repeat in am  Carotid Doppler unremarkable  2D Echo  EF 40-45% with global hypokinesia  LDL 69, on statin, meeting goal of LDL <100  HgbA1c pending  On coumadin for VTE prophylaxis  Afib - - on coumadin - rate controlled with metoprolol - INR therapeutic  UTI - on rocephine - treatment as per primary team - UTI could cause recrudescence of previous weakness.  Hypertension   Home meds: metoprolol and hydralazine. Resumed in hospital  SBP goal 140/80  Stable  Patient counseled to be compliant with his blood pressure medications  Hyperlipidemia  Home meds:  crestor. Resumed in hospital with lipitor  LDL 69  LDL goal < 100   Other Stroke Risk Factors Advanced age  Other Pertinent History  Left knee s/p arthroscopic surgery  Prostate cancer s/p radiation  Hospital day # 1  Rosalin Hawking, MD PhD Stroke Neurology 06/26/2014 5:38 PM    To contact Stroke Continuity provider, please refer to [email or web address] After hours, contact General Neurology

## 2014-06-27 ENCOUNTER — Inpatient Hospital Stay (HOSPITAL_COMMUNITY): Payer: Medicare Other

## 2014-06-27 LAB — BASIC METABOLIC PANEL
Anion gap: 13 (ref 5–15)
BUN: 41 mg/dL — AB (ref 6–23)
CALCIUM: 9.1 mg/dL (ref 8.4–10.5)
CHLORIDE: 104 meq/L (ref 96–112)
CO2: 24 mEq/L (ref 19–32)
CREATININE: 1.28 mg/dL (ref 0.50–1.35)
GFR, EST AFRICAN AMERICAN: 61 mL/min — AB (ref >90)
GFR, EST NON AFRICAN AMERICAN: 53 mL/min — AB (ref >90)
Glucose, Bld: 164 mg/dL — ABNORMAL HIGH (ref 70–99)
Potassium: 4.2 mEq/L (ref 3.7–5.3)
Sodium: 141 mEq/L (ref 137–147)

## 2014-06-27 LAB — CBC
HCT: 32 % — ABNORMAL LOW (ref 39.0–52.0)
Hemoglobin: 11.1 g/dL — ABNORMAL LOW (ref 13.0–17.0)
MCH: 33 pg (ref 26.0–34.0)
MCHC: 34.7 g/dL (ref 30.0–36.0)
MCV: 95.2 fL (ref 78.0–100.0)
Platelets: 68 10*3/uL — ABNORMAL LOW (ref 150–400)
RBC: 3.36 MIL/uL — ABNORMAL LOW (ref 4.22–5.81)
RDW: 13.8 % (ref 11.5–15.5)
WBC: 4.1 10*3/uL (ref 4.0–10.5)

## 2014-06-27 LAB — GLUCOSE, CAPILLARY
GLUCOSE-CAPILLARY: 132 mg/dL — AB (ref 70–99)
GLUCOSE-CAPILLARY: 163 mg/dL — AB (ref 70–99)
Glucose-Capillary: 175 mg/dL — ABNORMAL HIGH (ref 70–99)

## 2014-06-27 MED ORDER — UNABLE TO FIND: Status: DC

## 2014-06-27 MED ORDER — TORSEMIDE 20 MG PO TABS: ORAL_TABLET | ORAL | Status: DC

## 2014-06-27 MED ORDER — OXYCODONE HCL 5 MG PO TABS: 5.0000 mg | ORAL_TABLET | ORAL | Status: DC | PRN

## 2014-06-27 MED ORDER — CEFPODOXIME PROXETIL 100 MG PO TABS: 200.0000 mg | ORAL_TABLET | Freq: Two times a day (BID) | ORAL | Status: DC

## 2014-06-27 NOTE — Progress Notes (Signed)
Discharge instructions reviewed with patient, questions answered verbalized understanding.  Patient given prescriptions as well as prescription for outpatient therapy.  Patients medical records request faxed to medical records.  Patient transported to front of hospital to be taken home by spouse.  Patient in stable condition at time of discharge from [facility].

## 2014-06-27 NOTE — Discharge Summary (Signed)
[facility]  Physician Discharge Summary   Patient ID: Cameron Acosta MRN: [id number] DOB/AGE: 76-Apr-1937 76 y.o.  Admit date: 06/25/2014 Discharge date: 06/27/2014  PCP: Liliane Shi, MD  DISCHARGE DIAGNOSES:  Principal Problem:   Left hemiparesis Active Problems:   Acute renal failure   Dehydration   Atrial fibrillation   Cardiac pacemaker in situ   AICD (automatic cardioverter/defibrillator) present   History of prostate cancer   Thrombocytopenia   CKD (chronic kidney disease) stage 3, GFR 30-59 ml/min   Long term (current) use of anticoagulants   OSA (obstructive sleep apnea)   NASH (nonalcoholic steatohepatitis)   Radiation proctitis   DM type 2 causing renal disease   RECOMMENDATIONS FOR OUTPATIENT FOLLOW UP: 1. Needs close follow up with PCP for below mentioned issues. 2. Urine culture is still pending. 3. Needs follow up CXR in 4 weeks for non specific findings on CXR. 4. Patient declined home health and will do outpatient therapy  DISCHARGE CONDITION: fair  Diet recommendation: Heart Healthy  INITIAL HISTORY: 78 year old with PMH as below who sees physicians at [facility] presented after fall due to sudden left sided weakness. He also had an episode of vasovagal syncope.   Past medical history:  Past Medical History   Diagnosis  Date   .  Hypertension    .  Cancer    .  Atrial fibrillation    .  Pacemaker    .  History of prostate cancer  06/26/2014   .  AICD (automatic cardioverter/defibrillator) present  06/26/2014   .  Cardiac pacemaker in situ  06/26/2014   .  CKD (chronic kidney disease) stage 3, GFR 30-59 ml/min  06/26/2014   .  OSA (obstructive sleep apnea)  06/26/2014   .  NASH (nonalcoholic steatohepatitis)  06/26/2014   .  Thrombocytopenia  06/26/2014      Consultations:  Neurology  Procedures: 2D ECHO Study Conclusions - Left ventricle: The cavity size was normal. There was mild concentric hypertrophy. Systolic  function was mildly to moderately reduced. The estimated ejection fraction was in the range of 40% to 45%. Global hypokinesis with more prounounced anterior hypokinesis. The study is not technically sufficient to allow evaluation of LV diastolic function. - Aortic valve: Mildly calcified leaflets. There was no stenosis. There was mild regurgitation. - Mitral valve: Calcified annulus. - Left atrium: Severely dilated (Area 40 cm2). - Right ventricle: The cavity size was mildly dilated. AICD wire noted in right ventricle. Systolic function was normal. - Right atrium: Severely dilated (28 cm2). AICD wire noted in right atrium. - Tricuspid valve: There was moderate regurgitation. - Pulmonary arteries: PA peak pressure: 29 mm Hg (S).  Carotid Dopplers 1-39% ICA stenosis. Vertebral artery flow is antegrade.   HOSPITAL COURSE:   Left hemiparesis/Vasovagal Syncope This was concerning for stroke. Patient could not have a MRI as he has a pacemaker/AICD. Neurology saw the patient and were not impressed by his findings. Repeat CT did not show a stroke. He was seen by PT/OT and he would like OP therapy. He was given a prescription for same. He is already on warfarin and he was asked to continue same. LDL was 69.   Acute on Chronic Renal Failure stage 3  Baseline creatinine around 1.6 but has been around 2 previously (care-everywhere). Gentle hydration was provided. His creatinine is much better. We also held his diuretics and these will be restarted at lower dose. Should follow up with his PCP.  History of CAD and  probable nonischemic cardiomyopathy (despite CAD)  Follows with cardiology at [facility]. EF around 30-40%. ECHO showed EF of 40-45%. Has AICD/PM. PVC's noted on tele which are chronic. Trop negative. On BB. Has previous stents. Stable per last cardiology notes (care-everywhere). Resume Torsemide at lower dose. Non specific findings noted on CXR. He was asymptomatic. He will need follow up CXR in 4  weeks.  Chronic Atrial fibrillation  Continue BB and warfarin. HR stable.  PVCs Previously noted by his cardiologist.   History of Prostate Ca s/p radiation  He is experiencing difficulty with urination and has seen providers at [facility]. Self catheterizes. Asked to follow up with his urologist. Oxy IR PRN for pain.  UTI  He was placed on Ceftriaxone. Urine culture is pending. Will be discharged on Vantin.   Obstructive sleep apnea  Continue CPAP.   Thrombocytopenia likely secondary to NASH  Stable platelet counts compared to previous. No overt bleeding. LFT's normal.   DM2 with renal complications  Continue home medications. HBa1c 6.5.  Patient has been stable. His work up is completed. He can follow up with his PCP.   PERTINENT LABS: The results of significant diagnostics from this hospitalization (including imaging, microbiology, ancillary and laboratory) are listed below for reference.     Labs: Basic Metabolic Panel:  Recent Labs Lab 06/25/14 2147 06/25/14 2153 06/26/14 1335 06/27/14 0544  NA 143 140 140 141  K 4.2 3.9 4.5 4.2  CL 97 102 99 104  CO2 23  --  26 24  GLUCOSE 225* 224* 281* 164*  BUN 48* 46* 52* 41*  CREATININE 2.25* 2.60* 1.86* 1.28  CALCIUM 9.5  --  9.5 9.1   Liver Function Tests:  Recent Labs Lab 06/25/14 2147  AST 30  ALT 28  ALKPHOS 60  BILITOT 0.7  PROT 7.3  ALBUMIN 3.6   CBC:  Recent Labs Lab 06/25/14 2147 06/25/14 2153 06/26/14 0822 06/27/14 0544  WBC 6.2  --  4.7 4.1  NEUTROABS 4.0  --  3.0  --   HGB 12.5* 12.6* 11.3* 11.1*  HCT 35.3* 37.0* 32.3* 32.0*  MCV 95.4  --  95.3 95.2  PLT 95*  --  75* 68*   Cardiac Enzymes:  Recent Labs Lab 06/26/14 0139 06/26/14 0822 06/26/14 1335  TROPONINI <0.30 <0.30 <0.30   CBG:  Recent Labs Lab 06/25/14 2211 06/26/14 1255 06/26/14 1729 06/27/14 0648  GLUCAP 242* 272* 145* 163*     IMAGING STUDIES Ct Head Wo Contrast  06/27/2014   CLINICAL DATA:  Neck pain and left  leg weakness  EXAM: CT HEAD WITHOUT CONTRAST  CT CERVICAL SPINE WITHOUT CONTRAST  TECHNIQUE: Multidetector CT imaging of the head and cervical spine was performed following the standard protocol without intravenous contrast. Multiplanar CT image reconstructions of the cervical spine were also generated.  COMPARISON:  06/25/2014  FINDINGS: CT HEAD FINDINGS  The bony calvarium is intact. The paranasal sinuses are within normal limits. Diffuse atrophic changes are again identified. No findings to suggest acute hemorrhage, acute infarction or space-occupying mass lesion are noted.  CT CERVICAL SPINE FINDINGS  Seven cervical segments are well visualized. No acute fracture or acute facet abnormality is noted. Facet hypertrophic changes are noted. These are most prominent at the C2-3 level on the left. The surrounding soft tissue structures are within normal limits. The visualized lung apices are unremarkable. No other focal abnormality is seen.  IMPRESSION: CT head: Chronic changes without acute abnormality.  CT of the cervical spine: Degenerative changes  without acute abnormality.   Electronically Signed   By: Inez Catalina M.D.   On: 06/27/2014 08:22   Ct Head Wo Contrast  06/25/2014   CLINICAL DATA:  Code stroke. Weakness in the left leg noted around 5 p.m. Patient fell. Shortness of breath. Diaphoretic. Weakness. Left-sided facial paralysis.  EXAM: CT HEAD WITHOUT CONTRAST  TECHNIQUE: Contiguous axial images were obtained from the base of the skull through the vertex without intravenous contrast.  COMPARISON:  None.  FINDINGS: There is significant central and cortical atrophy. Periventricular white matter changes are consistent with small vessel disease. There is no intra or extra-axial fluid collection or mass lesion. The basilar cisterns and ventricles have a normal appearance. There is no CT evidence for acute infarction or hemorrhage. No calvarial fracture. There is atherosclerotic calcification of the internal  carotid arteries.  IMPRESSION: 1.  No evidence for acute intracranial abnormality. 2. Atrophy and small vessel disease. 3. Critical Value/emergent results were called by telephone at the time of interpretation on 06/25/2014 at 10:20 pm to Dr. Doy Mince, who verbally acknowledged these results.   Electronically Signed   By: Shon Hale M.D.   On: 06/25/2014 22:23   Ct Cervical Spine Wo Contrast  06/27/2014   CLINICAL DATA:  Neck pain and left leg weakness  EXAM: CT HEAD WITHOUT CONTRAST  CT CERVICAL SPINE WITHOUT CONTRAST  TECHNIQUE: Multidetector CT imaging of the head and cervical spine was performed following the standard protocol without intravenous contrast. Multiplanar CT image reconstructions of the cervical spine were also generated.  COMPARISON:  06/25/2014  FINDINGS: CT HEAD FINDINGS  The bony calvarium is intact. The paranasal sinuses are within normal limits. Diffuse atrophic changes are again identified. No findings to suggest acute hemorrhage, acute infarction or space-occupying mass lesion are noted.  CT CERVICAL SPINE FINDINGS  Seven cervical segments are well visualized. No acute fracture or acute facet abnormality is noted. Facet hypertrophic changes are noted. These are most prominent at the C2-3 level on the left. The surrounding soft tissue structures are within normal limits. The visualized lung apices are unremarkable. No other focal abnormality is seen.  IMPRESSION: CT head: Chronic changes without acute abnormality.  CT of the cervical spine: Degenerative changes without acute abnormality.   Electronically Signed   By: Inez Catalina M.D.   On: 06/27/2014 08:22   Portable Chest 1 View  06/26/2014   CLINICAL DATA:  Shortness of breath.  Congestive heart failure  EXAM: PORTABLE CHEST - 1 VIEW  COMPARISON:  09/10/2011  FINDINGS: AICD noted, lead distribution unchanged. Moderately enlarged cardiopericardial silhouette, although stable. Cephalization of blood flow. Mild thoracic spondylosis. No  airspace edema or definite Kerley B-lines. No pleural effusion identified.  There is slight indistinctness of the left cardiac border which is increased from the prior exam.  IMPRESSION: 1. Increased indistinctness the left cardiac border suspicious for a lingular airspace opacity. Asymmetric edema and pneumonia are differential diagnostic considerations. Followup chest radiography is recommended in 4 weeks time to ensure resolution and exclude underlying malignancy. 2. Moderately enlarged cardiopericardial silhouette with cephalization of blood flow compatible with pulmonary venous hypertension.  These results will be called to the ordering clinician or representative by the Radiologist Assistant, and communication documented in the PACS or zVision Dashboard.   Electronically Signed   By: Sherryl Barters M.D.   On: 06/26/2014 08:20    DISCHARGE EXAMINATION: Filed Vitals:   06/26/14 1700 06/26/14 2139 06/27/14 0215 06/27/14 0646  BP: 129/72 127/70 122/57 124/66  Pulse: 69 69 75 70  Temp: 99 F (37.2 C) 98.6 F (37 C) 98.6 F (37 C) 97.9 F (36.6 C)  TempSrc: Oral Oral Oral Oral  Resp: 18 18 20 20   SpO2: 100% 100% 100% 100%   General appearance: alert, cooperative, appears stated age and no distress Resp: clear to auscultation bilaterally Cardio: regular rate and rhythm, S1, S2 normal, no murmur, click, rub or gallop  DISPOSITION: Home with wife. Patient declined home health  Discharge Instructions   Ambulatory referral to Occupational Therapy    Complete by:  As directed      Ambulatory referral to Physical Therapy    Complete by:  As directed      Diet Carb Modified    Complete by:  As directed      Discharge instructions    Complete by:  As directed   Follow up with either your cardiologist or your PCP early next week. You will need blood work to check your renal function and potassium levels on lower dose of Torsemide. Please call your Urologist for further management of your  urinary complaints.     Increase activity slowly    Complete by:  As directed            ALLERGIES: No Known Allergies  Current Discharge Medication List    START taking these medications   Details  cefpodoxime (VANTIN) 100 MG tablet Take 2 tablets (200 mg total) by mouth 2 (two) times daily. For 5 more days Qty: 10 tablet, Refills: 0    oxyCODONE (OXY IR/ROXICODONE) 5 MG immediate release tablet Take 1 tablet (5 mg total) by mouth every 4 (four) hours as needed for severe pain. Qty: 20 tablet, Refills: 0      CONTINUE these medications which have CHANGED   Details  torsemide (DEMADEX) 20 MG tablet Take 40mg  once daily for now till seen in follow up by your cardiologist or PCP. Qty: 60 tablet, Refills: 0      CONTINUE these medications which have NOT CHANGED   Details  glipiZIDE (GLUCOTROL XL) 2.5 MG 24 hr tablet Take 2.5 mg by mouth daily.      hydrALAZINE (APRESOLINE) 25 MG tablet Take 25 mg by mouth 2 (two) times daily.    losartan (COZAAR) 50 MG tablet Take 50 mg by mouth every evening.      metoprolol (TOPROL-XL) 50 MG 24 hr tablet Take 50 mg by mouth daily.      omega-3 acid ethyl esters (LOVAZA) 1 G capsule Take 2 g by mouth 2 (two) times daily.      rosuvastatin (CRESTOR) 10 MG tablet Take 10 mg by mouth every evening.     sertraline (ZOLOFT) 50 MG tablet Take 50 mg by mouth at bedtime.    warfarin (COUMADIN) 6 MG tablet Take 6 mg by mouth every evening.    zolpidem (AMBIEN) 10 MG tablet Take 10 mg by mouth at bedtime.        STOP taking these medications     naproxen sodium (ANAPROX) 220 MG tablet        Follow-up Information   Follow up with WIGAND-BOLLING,GWENDOLYN, MD. Schedule an appointment as soon as possible for a visit in 1 week. (post hospitalization follow up)    Specialty:  Family Medicine   Contact information:   [place] [id number] [phone]       TOTAL DISCHARGE TIME: 59  mins  [facility] Pager [phone]  06/27/2014, 10:06 AM  Disclaimer: This note was dictated with voice recognition software. Similar sounding words can inadvertently be transcribed and may not be corrected upon review.

## 2014-06-27 NOTE — Plan of Care (Signed)
  repeat CT head showed:  CT head: Chronic changes without acute abnormality.  CT of the cervical spine: Degenerative changes without acute  Abnormality.  Pt was discharged before I see him today.  I will not put any charges for today. Continue coumadin and INR 2-3.

## 2014-06-27 NOTE — Progress Notes (Signed)
Patient is discharging home today and MD ordered Outpatient PT/ OT; Patient request a hard script to set it up himself; he wants his therapy to be done in [place] where his wife attends therapy but does not know the name of the facility; Attending MD made aware; Mindi Slicker RN,BSN,MHA [phone]

## 2014-06-27 NOTE — Discharge Instructions (Signed)

## 2014-06-29 LAB — URINE CULTURE: Colony Count: >=100000

## 2014-07-02 NOTE — ED Provider Notes (Signed)
I saw and evaluated the patient, reviewed the resident's note and I agree with the findings and plan.   .Face to face Exam:  General:  Awake HEENT:  Atraumatic Resp:  Normal effort Abd:  Nondistended Neuro facial asymmetry  CRITICAL CARE Performed by: Leonard Schwartz L Total critical care time: 30 min Critical care time was exclusive of separately billable procedures and treating other patients. Critical care was necessary to treat or prevent imminent or life-threatening deterioration. Critical care was time spent personally by me on the following activities: development of treatment plan with patient and/or surrogate as well as nursing, discussions with consultants, evaluation of patient's response to treatment, examination of patient, obtaining history from patient or surrogate, ordering and performing treatments and interventions, ordering and review of laboratory studies, ordering and review of radiographic studies, pulse oximetry and re-evaluation of patient's condition.  Patient seen and evaluated by neurology  Dot Lanes, MD 07/02/14 [phone]

## 2014-08-22 ENCOUNTER — Emergency Department (HOSPITAL_COMMUNITY): Payer: Medicare Other

## 2014-08-22 ENCOUNTER — Encounter (HOSPITAL_COMMUNITY): Payer: Self-pay

## 2014-08-22 ENCOUNTER — Inpatient Hospital Stay (HOSPITAL_COMMUNITY)
Admission: EM | Admit: 2014-08-22 | Discharge: 2014-08-23 | DRG: 536 | Disposition: A | Discharge status: Other Acute Inpatient Hospital | Payer: Medicare Other | Attending: Family Medicine | Admitting: Family Medicine

## 2014-08-22 DIAGNOSIS — Z95810 Presence of automatic (implantable) cardiac defibrillator: Secondary | ICD-10-CM | POA: Diagnosis not present

## 2014-08-22 DIAGNOSIS — G4733 Obstructive sleep apnea (adult) (pediatric): Secondary | ICD-10-CM | POA: Diagnosis present

## 2014-08-22 DIAGNOSIS — Z79899 Other long term (current) drug therapy: Secondary | ICD-10-CM

## 2014-08-22 DIAGNOSIS — M810 Age-related osteoporosis without current pathological fracture: Secondary | ICD-10-CM | POA: Diagnosis present

## 2014-08-22 DIAGNOSIS — I4891 Unspecified atrial fibrillation: Secondary | ICD-10-CM | POA: Diagnosis present

## 2014-08-22 DIAGNOSIS — I129 Hypertensive chronic kidney disease with stage 1 through stage 4 chronic kidney disease, or unspecified chronic kidney disease: Secondary | ICD-10-CM | POA: Diagnosis present

## 2014-08-22 DIAGNOSIS — I5020 Unspecified systolic (congestive) heart failure: Secondary | ICD-10-CM | POA: Diagnosis present

## 2014-08-22 DIAGNOSIS — N183 Chronic kidney disease, stage 3 (moderate): Secondary | ICD-10-CM | POA: Diagnosis not present

## 2014-08-22 DIAGNOSIS — D689 Coagulation defect, unspecified: Secondary | ICD-10-CM | POA: Diagnosis not present

## 2014-08-22 DIAGNOSIS — Y939 Activity, unspecified: Secondary | ICD-10-CM | POA: Diagnosis not present

## 2014-08-22 DIAGNOSIS — Y92002 Bathroom of unspecified non-institutional (private) residence single-family (private) house as the place of occurrence of the external cause: Secondary | ICD-10-CM

## 2014-08-22 DIAGNOSIS — Z87891 Personal history of nicotine dependence: Secondary | ICD-10-CM

## 2014-08-22 DIAGNOSIS — W19XXXA Unspecified fall, initial encounter: Secondary | ICD-10-CM

## 2014-08-22 DIAGNOSIS — S72009A Fracture of unspecified part of neck of unspecified femur, initial encounter for closed fracture: Secondary | ICD-10-CM | POA: Diagnosis present

## 2014-08-22 DIAGNOSIS — E1122 Type 2 diabetes mellitus with diabetic chronic kidney disease: Secondary | ICD-10-CM

## 2014-08-22 DIAGNOSIS — Z950 Presence of cardiac pacemaker: Secondary | ICD-10-CM

## 2014-08-22 DIAGNOSIS — Z8546 Personal history of malignant neoplasm of prostate: Secondary | ICD-10-CM | POA: Diagnosis not present

## 2014-08-22 DIAGNOSIS — W010XXA Fall on same level from slipping, tripping and stumbling without subsequent striking against object, initial encounter: Secondary | ICD-10-CM | POA: Diagnosis not present

## 2014-08-22 DIAGNOSIS — D696 Thrombocytopenia, unspecified: Secondary | ICD-10-CM | POA: Diagnosis present

## 2014-08-22 DIAGNOSIS — Z9049 Acquired absence of other specified parts of digestive tract: Secondary | ICD-10-CM | POA: Diagnosis present

## 2014-08-22 DIAGNOSIS — N189 Chronic kidney disease, unspecified: Secondary | ICD-10-CM

## 2014-08-22 DIAGNOSIS — E1165 Type 2 diabetes mellitus with hyperglycemia: Secondary | ICD-10-CM | POA: Diagnosis not present

## 2014-08-22 DIAGNOSIS — Z7901 Long term (current) use of anticoagulants: Secondary | ICD-10-CM

## 2014-08-22 DIAGNOSIS — Z86010 Personal history of colonic polyps: Secondary | ICD-10-CM

## 2014-08-22 DIAGNOSIS — E1129 Type 2 diabetes mellitus with other diabetic kidney complication: Secondary | ICD-10-CM | POA: Diagnosis present

## 2014-08-22 DIAGNOSIS — S72141A Displaced intertrochanteric fracture of right femur, initial encounter for closed fracture: Principal | ICD-10-CM | POA: Diagnosis present

## 2014-08-22 DIAGNOSIS — D649 Anemia, unspecified: Secondary | ICD-10-CM | POA: Diagnosis present

## 2014-08-22 DIAGNOSIS — M25551 Pain in right hip: Secondary | ICD-10-CM | POA: Diagnosis present

## 2014-08-22 HISTORY — DX: Nonrheumatic mitral (valve) insufficiency: I34.0

## 2014-08-22 HISTORY — DX: Heart failure, unspecified: I50.9

## 2014-08-22 LAB — TYPE AND SCREEN
ABO/RH(D): A POS
ANTIBODY SCREEN: NEGATIVE

## 2014-08-22 LAB — CBC WITH DIFFERENTIAL/PLATELET
Basophils Absolute: 0 10*3/uL (ref 0.0–0.1)
Basophils Relative: 0 % (ref 0–1)
EOS ABS: 0.1 10*3/uL (ref 0.0–0.7)
EOS PCT: 1 % (ref 0–5)
HCT: 34.4 % — ABNORMAL LOW (ref 39.0–52.0)
HEMOGLOBIN: 12 g/dL — AB (ref 13.0–17.0)
LYMPHS ABS: 0.3 10*3/uL — AB (ref 0.7–4.0)
Lymphocytes Relative: 6 % — ABNORMAL LOW (ref 12–46)
MCH: 33.3 pg (ref 26.0–34.0)
MCHC: 34.9 g/dL (ref 30.0–36.0)
MCV: 95.6 fL (ref 78.0–100.0)
MONOS PCT: 8 % (ref 3–12)
Monocytes Absolute: 0.4 10*3/uL (ref 0.1–1.0)
Neutro Abs: 4.2 10*3/uL (ref 1.7–7.7)
Neutrophils Relative %: 85 % — ABNORMAL HIGH (ref 43–77)
PLATELETS: 86 10*3/uL — AB (ref 150–400)
RBC: 3.6 MIL/uL — AB (ref 4.22–5.81)
RDW: 13.4 % (ref 11.5–15.5)
WBC: 4.9 10*3/uL (ref 4.0–10.5)

## 2014-08-22 LAB — BASIC METABOLIC PANEL
Anion gap: 14 (ref 5–15)
BUN: 43 mg/dL — ABNORMAL HIGH (ref 6–23)
CO2: 24 mEq/L (ref 19–32)
Calcium: 8.8 mg/dL (ref 8.4–10.5)
Chloride: 94 mEq/L — ABNORMAL LOW (ref 96–112)
Creatinine, Ser: 1.48 mg/dL — ABNORMAL HIGH (ref 0.50–1.35)
GFR calc Af Amer: 51 mL/min — ABNORMAL LOW (ref >90)
GFR calc non Af Amer: 44 mL/min — ABNORMAL LOW (ref >90)
GLUCOSE: 178 mg/dL — AB (ref 70–99)
POTASSIUM: 3.9 meq/L (ref 3.7–5.3)
Sodium: 132 mEq/L — ABNORMAL LOW (ref 137–147)

## 2014-08-22 LAB — ABO/RH: ABO/RH(D): A POS

## 2014-08-22 LAB — PROTIME-INR
INR: 3.4 — AB (ref 0.00–1.49)
PROTHROMBIN TIME: 34.6 s — AB (ref 11.6–15.2)

## 2014-08-22 MED ORDER — TORSEMIDE 20 MG PO TABS
20.0000 mg | ORAL_TABLET | Freq: Every day | ORAL | Status: DC
  Administered 2014-08-23: 20 mg via ORAL
  Filled 2014-08-22: qty 1

## 2014-08-22 MED ORDER — ZOLPIDEM TARTRATE 5 MG PO TABS
5.0000 mg | ORAL_TABLET | Freq: Every day | ORAL | Status: DC
  Administered 2014-08-23: 5 mg via ORAL
  Filled 2014-08-22: qty 1

## 2014-08-22 MED ORDER — CARVEDILOL 3.125 MG PO TABS
3.1250 mg | ORAL_TABLET | Freq: Two times a day (BID) | ORAL | Status: DC
  Administered 2014-08-23: 3.125 mg via ORAL
  Filled 2014-08-22 (×3): qty 1

## 2014-08-22 MED ORDER — SODIUM CHLORIDE 0.9 % IV SOLN
INTRAVENOUS | Status: AC
  Administered 2014-08-22: via INTRAVENOUS

## 2014-08-22 MED ORDER — ACETAMINOPHEN 325 MG PO TABS
650.0000 mg | ORAL_TABLET | Freq: Four times a day (QID) | ORAL | Status: DC | PRN
  Administered 2014-08-23: 650 mg via ORAL
  Filled 2014-08-22: qty 2

## 2014-08-22 MED ORDER — ONDANSETRON HCL 4 MG/2ML IJ SOLN
4.0000 mg | Freq: Once | INTRAMUSCULAR | Status: AC
Start: 2014-08-22 — End: 2014-08-22
  Administered 2014-08-22: 4 mg via INTRAVENOUS
  Filled 2014-08-22: qty 2

## 2014-08-22 MED ORDER — ROSUVASTATIN CALCIUM 10 MG PO TABS
10.0000 mg | ORAL_TABLET | Freq: Every evening | ORAL | Status: DC
  Filled 2014-08-22: qty 1

## 2014-08-22 MED ORDER — SERTRALINE HCL 50 MG PO TABS
50.0000 mg | ORAL_TABLET | Freq: Every day | ORAL | Status: DC
  Administered 2014-08-23: 50 mg via ORAL
  Filled 2014-08-22 (×2): qty 1

## 2014-08-22 MED ORDER — HYDRALAZINE HCL 25 MG PO TABS
25.0000 mg | ORAL_TABLET | Freq: Two times a day (BID) | ORAL | Status: DC
  Administered 2014-08-23 (×2): 25 mg via ORAL
  Filled 2014-08-22 (×3): qty 1

## 2014-08-22 MED ORDER — ONDANSETRON HCL 4 MG/2ML IJ SOLN: 4.0000 mg | Freq: Three times a day (TID) | INTRAMUSCULAR | Status: DC | PRN

## 2014-08-22 MED ORDER — HYDROMORPHONE HCL 1 MG/ML IJ SOLN
INTRAMUSCULAR | Status: AC
  Filled 2014-08-22: qty 1

## 2014-08-22 MED ORDER — MORPHINE SULFATE 4 MG/ML IJ SOLN
4.0000 mg | INTRAMUSCULAR | Status: DC | PRN
  Administered 2014-08-22: 4 mg via INTRAVENOUS
  Filled 2014-08-22: qty 1

## 2014-08-22 MED ORDER — LOSARTAN POTASSIUM 50 MG PO TABS
50.0000 mg | ORAL_TABLET | Freq: Every evening | ORAL | Status: DC
  Filled 2014-08-22: qty 1

## 2014-08-22 MED ORDER — CARVEDILOL PHOSPHATE ER 10 MG PO CP24: 10.0000 mg | ORAL_CAPSULE | Freq: Every day | ORAL | Status: DC

## 2014-08-22 MED ORDER — SODIUM CHLORIDE 0.9 % IJ SOLN
3.0000 mL | Freq: Two times a day (BID) | INTRAMUSCULAR | Status: DC
  Administered 2014-08-23 (×2): 3 mL via INTRAVENOUS

## 2014-08-22 MED ORDER — HYDROMORPHONE HCL 1 MG/ML IJ SOLN
1.0000 mg | INTRAMUSCULAR | Status: DC | PRN
  Administered 2014-08-22 – 2014-08-23 (×5): 1 mg via INTRAVENOUS
  Filled 2014-08-22 (×4): qty 1

## 2014-08-22 MED ORDER — OXYCODONE HCL 5 MG PO TABS
5.0000 mg | ORAL_TABLET | ORAL | Status: DC | PRN
  Administered 2014-08-23: 5 mg via ORAL
  Filled 2014-08-22: qty 1

## 2014-08-22 MED ORDER — HYDROCODONE-ACETAMINOPHEN 5-325 MG PO TABS: 1.0000 | ORAL_TABLET | ORAL | Status: AC | PRN

## 2014-08-22 MED ORDER — ZOLPIDEM TARTRATE 10 MG PO TABS: 10.0000 mg | ORAL_TABLET | Freq: Every day | ORAL | Status: DC

## 2014-08-22 MED ORDER — INSULIN ASPART 100 UNIT/ML ~~LOC~~ SOLN
0.0000 [IU] | Freq: Three times a day (TID) | SUBCUTANEOUS | Status: DC
  Administered 2014-08-23 (×2): 2 [IU] via SUBCUTANEOUS

## 2014-08-22 MED ORDER — HYDROMORPHONE HCL 1 MG/ML IJ SOLN
1.0000 mg | Freq: Once | INTRAMUSCULAR | Status: AC
  Administered 2014-08-22: 1 mg via INTRAVENOUS
  Filled 2014-08-22: qty 1

## 2014-08-22 MED ORDER — ONDANSETRON HCL 4 MG/2ML IJ SOLN: 4.0000 mg | Freq: Four times a day (QID) | INTRAMUSCULAR | Status: DC | PRN

## 2014-08-22 MED ORDER — PHYTONADIONE 5 MG PO TABS
2.5000 mg | ORAL_TABLET | Freq: Once | ORAL | Status: AC
  Administered 2014-08-23: 2.5 mg via ORAL
  Filled 2014-08-22: qty 1

## 2014-08-22 MED ORDER — ACETAMINOPHEN 650 MG RE SUPP: 650.0000 mg | Freq: Four times a day (QID) | RECTAL | Status: DC | PRN

## 2014-08-22 NOTE — ED Notes (Signed)
Patient transported to CT 

## 2014-08-22 NOTE — ED Notes (Signed)
Bed: WA03 Expected date:  Expected time:  Means of arrival:  Comments: EMS 

## 2014-08-22 NOTE — ED Notes (Signed)
Condom cath placed on pt 

## 2014-08-22 NOTE — ED Provider Notes (Signed)
CSN: [id number]     Arrival date & time 08/22/14  1849 History   First MD Initiated Contact with Patient 08/22/14 1904     Chief Complaint  Patient presents with  . Fall     (Consider location/radiation/quality/duration/timing/severity/associated sxs/prior Treatment) HPI Comments: Patient is a 78 year old male with history of hypertension, prostate cancer, atrial fibrillation, pacemaker, chronic kidney disease, obstructive sleep apnea, nonalcoholic steatohepatitis, CHF who presents the emergency department today after fall. He reports that he slipped on a tile floor prior to arrival. He hit his right hip and his head. He did not lose consciousness. He reports that he fell because he lost his balance. No headache, dizziness, lightheadedness, neck pain, numbness, weakness. He is still in significant pain after 300 g of fentanyl. His orthopedic physician is Dr. Wynelle Link. Yesterday the patient underwent urologic testing at [facility]. He keeps a urinal at his beside because he does not have control over his bladder. This is chronic. He has not eaten anything today. He drank approximately 1L of fluid throughout the day.   The history is provided by the patient and the spouse. No language interpreter was used.    Past Medical History  Diagnosis Date  . Hypertension   . Cancer   . Atrial fibrillation   . Pacemaker   . History of prostate cancer 06/26/2014  . AICD (automatic cardioverter/defibrillator) present 06/26/2014  . Cardiac pacemaker in situ 06/26/2014  . CKD (chronic kidney disease) stage 3, GFR 30-59 ml/min 06/26/2014  . OSA (obstructive sleep apnea) 06/26/2014  . NASH (nonalcoholic steatohepatitis) 06/26/2014  . Thrombocytopenia 06/26/2014   Past Surgical History  Procedure Laterality Date  . Ablasion     History reviewed. No pertinent family history. History  Substance Use Topics  . Smoking status: Former Research scientist (life sciences)  . Smokeless tobacco: Never Used  . Alcohol Use: Yes     Comment:  occasionally    Review of Systems  Constitutional: Negative for fever and chills.  Respiratory: Negative for shortness of breath.   Cardiovascular: Negative for chest pain.  Gastrointestinal: Negative for nausea, vomiting and abdominal pain.  Musculoskeletal: Positive for myalgias and arthralgias.  Neurological: Negative for dizziness, light-headedness and headaches.  All other systems reviewed and are negative.     Allergies  Review of patient's allergies indicates no known allergies.  Home Medications   Prior to Admission medications   Medication Sig Start Date End Date Taking? Authorizing Provider  carvedilol (COREG CR) 20 MG 24 hr capsule Take 10 mg by mouth daily.   Yes Historical Provider, MD  glipiZIDE (GLUCOTROL XL) 2.5 MG 24 hr tablet Take 2.5 mg by mouth daily.     Yes Historical Provider, MD  hydrALAZINE (APRESOLINE) 25 MG tablet Take 25 mg by mouth 2 (two) times daily.   Yes Historical Provider, MD  losartan (COZAAR) 50 MG tablet Take 50 mg by mouth every evening.     Yes Historical Provider, MD  oxyCODONE (OXY IR/ROXICODONE) 5 MG immediate release tablet Take 1 tablet (5 mg total) by mouth every 4 (four) hours as needed for severe pain. 06/27/14  Yes Bonnielee Haff, MD  rosuvastatin (CRESTOR) 10 MG tablet Take 10 mg by mouth every evening.    Yes Historical Provider, MD  sertraline (ZOLOFT) 50 MG tablet Take 50 mg by mouth at bedtime.   Yes Historical Provider, MD  torsemide (DEMADEX) 20 MG tablet Take 40mg  once daily for now till seen in follow up by your cardiologist or PCP. 06/27/14  Yes Gokul  [place] Pink, MD  warfarin (COUMADIN) 6 MG tablet Take 6 mg by mouth every evening.   Yes Historical Provider, MD  zolpidem (AMBIEN) 10 MG tablet Take 10 mg by mouth at bedtime.     Yes Historical Provider, MD  cefpodoxime (VANTIN) 100 MG tablet Take 2 tablets (200 mg total) by mouth 2 (two) times daily. For 5 more days 06/27/14   Bonnielee Haff, MD  metoprolol (TOPROL-XL) 50 MG 24 hr  tablet Take 50 mg by mouth daily.      Historical Provider, MD  omega-3 acid ethyl esters (LOVAZA) 1 G capsule Take 2 g by mouth 2 (two) times daily.      Historical Provider, MD  UNABLE TO FIND Outpatient PT and OT for deconditioning from UT and ARF 06/27/14   Bonnielee Haff, MD   BP 108/57 mmHg  Pulse 70  Temp(Src) 98.1 F (36.7 C) (Oral)  Resp 16  SpO2 100% Physical Exam  Constitutional: He is oriented to person, place, and time. He appears well-developed and well-nourished. No distress.  obese  HENT:  Head: Normocephalic and atraumatic.  Right Ear: External ear normal.  Left Ear: External ear normal.  Nose: Nose normal.  Eyes: Conjunctivae are normal.  Neck: Normal range of motion. No spinous process tenderness and no muscular tenderness present. No tracheal deviation present.  Cardiovascular: Normal rate, regular rhythm, normal heart sounds, intact distal pulses and normal pulses.   Pulses:      Radial pulses are 2+ on the right side, and 2+ on the left side.       Dorsalis pedis pulses are 2+ on the right side, and 2+ on the left side.       Posterior tibial pulses are 2+ on the right side, and 2+ on the left side.  Pulmonary/Chest: Effort normal and breath sounds normal. No stridor.  Abdominal: Soft. He exhibits no distension. There is no tenderness.  Musculoskeletal: Normal range of motion.       Legs: Tender to palpation to anterior and lateral right hip Shortening of right leg Sensation intact throughout all extremities.  Neurological: He is alert and oriented to person, place, and time.  Skin: Skin is warm and dry. He is not diaphoretic.  Psychiatric: He has a normal mood and affect. His behavior is normal.  Nursing note and vitals reviewed.   ED Course  Procedures (including critical care time) Labs Review Labs Reviewed  BASIC METABOLIC PANEL - Abnormal; Notable for the following:    Sodium 132 (*)    Chloride 94 (*)    Glucose, Bld 178 (*)    BUN 43 (*)     Creatinine, Ser 1.48 (*)    GFR calc non Af Amer 44 (*)    GFR calc Af Amer 51 (*)    All other components within normal limits  CBC WITH DIFFERENTIAL - Abnormal; Notable for the following:    RBC 3.60 (*)    Hemoglobin 12.0 (*)    HCT 34.4 (*)    Neutrophils Relative % 85 (*)    Lymphocytes Relative 6 (*)    Lymphs Abs 0.3 (*)    All other components within normal limits  PROTIME-INR - Abnormal; Notable for the following:    Prothrombin Time 34.6 (*)    INR 3.40 (*)    All other components within normal limits  TYPE AND SCREEN  ABO/RH    Imaging Review Dg Hip Complete Right  08/22/2014   CLINICAL DATA:  Right hip pain  post fall, slipped on tiled floor  EXAM: RIGHT HIP - COMPLETE 2+ VIEW  COMPARISON:  None.  FINDINGS: Three views of the right hip submitted. Study is limited by diffuse osteopenia. No displaced fracture or subluxation. There is a vague lucent line right femoral neck. Subtle lucent line in the region of greater femoral trochanter. Subtle nondisplaced proximal femur fracture cannot be excluded. Clinical correlation is necessary. Further correlation with CT or MRI could be performed as clinically warranted.  IMPRESSION: Study is limited by diffuse osteopenia. No displaced fracture or subluxation. There is a vague lucent line right femoral neck. Subtle lucent line in the region of greater femoral trochanter. Subtle nondisplaced proximal femur fracture cannot be excluded. Clinical correlation is necessary. Further correlation with CT or MRI could be performed as clinically warranted.   Electronically Signed   By: Lahoma Crocker M.D.   On: 08/22/2014 20:05   Ct Head Wo Contrast  08/22/2014   CLINICAL DATA:  FALL Comments: PT FELL AT HOME TODAY SLIPPING ON FLOOR. PT STATES HE HIT THE BACK OF HIS HEAD. PT IS ON BLOOD THINNERS  EXAM: CT HEAD WITHOUT CONTRAST  CT CERVICAL SPINE WITHOUT CONTRAST  TECHNIQUE: Multidetector CT imaging of the head and cervical spine was performed following the  standard protocol without intravenous contrast. Multiplanar CT image reconstructions of the cervical spine were also generated.  COMPARISON:  06/27/2014  FINDINGS: CT HEAD FINDINGS  The ventricles, cisterns and other CSF spaces are within normal. There is mild chronic ischemic microvascular disease. There is no mass, mass effect, shift of midline structures or acute hemorrhage. There is no evidence of acute infarction. There is no evidence of fracture. No significant soft tissue injury.  CT CERVICAL SPINE FINDINGS  Vertebral body alignment, heights and disc space heights are within normal. There is mild to moderate spondylosis present throughout the cervical spine. Prevertebral soft tissues are normal. Atlantoaxial articulation is within normal. There is uncovertebral joint spurring and facet arthropathy. Facet arthropathy is most prominent on the left at the C2-3 level. There is no acute fracture or subluxation. Remaining soft tissue structures are unremarkable.  IMPRESSION: No acute intracranial findings.  No acute cervical spine injury.  Mild chronic ischemic microvascular disease.  Mild to moderate spondylosis of the cervical spine.   Electronically Signed   By: Marin Olp M.D.   On: 08/22/2014 19:58   Ct Cervical Spine Wo Contrast  08/22/2014   CLINICAL DATA:  FALL Comments: PT FELL AT HOME TODAY SLIPPING ON FLOOR. PT STATES HE HIT THE BACK OF HIS HEAD. PT IS ON BLOOD THINNERS  EXAM: CT HEAD WITHOUT CONTRAST  CT CERVICAL SPINE WITHOUT CONTRAST  TECHNIQUE: Multidetector CT imaging of the head and cervical spine was performed following the standard protocol without intravenous contrast. Multiplanar CT image reconstructions of the cervical spine were also generated.  COMPARISON:  06/27/2014  FINDINGS: CT HEAD FINDINGS  The ventricles, cisterns and other CSF spaces are within normal. There is mild chronic ischemic microvascular disease. There is no mass, mass effect, shift of midline structures or acute  hemorrhage. There is no evidence of acute infarction. There is no evidence of fracture. No significant soft tissue injury.  CT CERVICAL SPINE FINDINGS  Vertebral body alignment, heights and disc space heights are within normal. There is mild to moderate spondylosis present throughout the cervical spine. Prevertebral soft tissues are normal. Atlantoaxial articulation is within normal. There is uncovertebral joint spurring and facet arthropathy. Facet arthropathy is most prominent on the left  at the C2-3 level. There is no acute fracture or subluxation. Remaining soft tissue structures are unremarkable.  IMPRESSION: No acute intracranial findings.  No acute cervical spine injury.  Mild chronic ischemic microvascular disease.  Mild to moderate spondylosis of the cervical spine.   Electronically Signed   By: Marin Olp M.D.   On: 08/22/2014 19:58     EKG Interpretation None      MDM   Final diagnoses:  Fall    Patient presents emergency department after mechanical fall. Patient with comminuted, nondisplaced right intertrochanteric fracture. There is a fracture cleft which involves the base of the right femoral neck. Discussed this case with Dr. Gladstone Lighter who recommends NPO after midnight and Dr. Wynelle Link will see him in the morning. Will admit to medicine. Admission and consultation are appreciated. Vital signs stable at this time. Pain is controlled after fentanyl, morphine, dilaudid. Dr. Ralene Bathe evaluated patient and agrees with plan. Patient / Family / Caregiver informed of clinical course, understand medical decision-making process, and agree with plan.    Elwyn Lade, PA-C 08/23/14 0153  Quintella Reichert, MD 08/23/14 0157

## 2014-08-22 NOTE — ED Notes (Signed)
Per EMS- Patient slipped on a tiled floor at home. Patient reports that he hit his head on the floor and c/o right pelvic pain. Patient has shortening of the right leg. Patient received a total of 300 mcg Fentanyl, the last being given at 1818. Patient is currently on Coumadin.

## 2014-08-22 NOTE — H&P (Signed)
Cameron Acosta is an 78 y.o. male.    Pcp:  Dr. Barnet Pall [facility], [place]) Cardiologist:  Dr. Purcell Nails ([place], [place], [place])  Chief Complaint: fall, right intertrochanteric hip fracture HPI: 78 yo male with Afib, CHF (EF 40-45%), Mild MR, Osa, apparently presented due to mechanical fall in bathroom,  Pt was coming out when fell to the side and injured his right hip.  Denies syncope, cp, palp, sob, n/v, diarrhea, brbpr, black stool.  Pt was brought to ED for evaluation and found to have right intertrochanteric hip fracture.  Pt had mild renal insufficiency, and also coagulopathy with INR 3.4  ,  Pt will be admitted for right intertrochanteric hip fracture.   Past Medical History  Diagnosis Date  . Hypertension   . Cancer   . Atrial fibrillation   . Pacemaker   . History of prostate cancer 06/26/2014  . AICD (automatic cardioverter/defibrillator) present 06/26/2014  . Cardiac pacemaker in situ 06/26/2014  . CKD (chronic kidney disease) stage 3, GFR 30-59 ml/min 06/26/2014  . OSA (obstructive sleep apnea) 06/26/2014    on cpap  . NASH (nonalcoholic steatohepatitis) 06/26/2014  . Thrombocytopenia 06/26/2014  . CHF (congestive heart failure)     EF 40-45% on echo 06/26/2014  . Mitral regurgitation     Mild on echo 06/26/2014    Past Surgical History  Procedure Laterality Date  . Ablasion    . Appendectomy    . Cholecystectomy    . Uvulectomy    . Knee arthroscopy Left   . Shoulder surgery Right   . Colonoscopy  2015    polyps (repeat per pt in 2018) Dr. Darden Amber at [facility]      for polyp per pt (Dr. Morton Stall at [facility])    Family History  Problem Relation Age of Onset  . Congestive Heart Failure Father   . Dementia Mother    Social History:  reports that he quit smoking about 31 years ago. His smoking use included Cigarettes. He has a 1.75 pack-year smoking history. He has never used smokeless tobacco. He reports that he drinks about 3.0 oz of alcohol per week. He  reports that he does not use illicit drugs.  Allergies: No Known Allergies   (Not in a hospital admission)  Results for orders placed or performed during the hospital encounter of 08/22/14 (from the past 48 hour(s))  Basic metabolic panel     Status: Abnormal   Collection Time: 08/22/14  7:50 PM  Result Value Ref Range   Sodium 132 (L) 137 - 147 mEq/L   Potassium 3.9 3.7 - 5.3 mEq/L   Chloride 94 (L) 96 - 112 mEq/L   CO2 24 19 - 32 mEq/L   Glucose, Bld 178 (H) 70 - 99 mg/dL   BUN 43 (H) 6 - 23 mg/dL   Creatinine, Ser 1.48 (H) 0.50 - 1.35 mg/dL   Calcium 8.8 8.4 - 10.5 mg/dL   GFR calc non Af Amer 44 (L) >90 mL/min   GFR calc Af Amer 51 (L) >90 mL/min    Comment: (NOTE) The eGFR has been calculated using the CKD EPI equation. This calculation has not been validated in all clinical situations. eGFR's persistently <90 mL/min signify possible Chronic Kidney Disease.    Anion gap 14 5 - 15  CBC WITH DIFFERENTIAL     Status: Abnormal   Collection Time: 08/22/14  7:50 PM  Result Value Ref Range   WBC 4.9 4.0 - 10.5 K/uL   RBC  3.60 (L) 4.22 - 5.81 MIL/uL   Hemoglobin 12.0 (L) 13.0 - 17.0 g/dL   HCT 34.4 (L) 39.0 - 52.0 %   MCV 95.6 78.0 - 100.0 fL   MCH 33.3 26.0 - 34.0 pg   MCHC 34.9 30.0 - 36.0 g/dL   RDW 13.4 11.5 - 15.5 %   Platelets 86 (L) 150 - 400 K/uL    Comment: SPECIMEN CHECKED FOR CLOTS REPEATED TO VERIFY PLATELET COUNT CONFIRMED BY SMEAR LARGE PLATELETS PRESENT    Neutrophils Relative % 85 (H) 43 - 77 %   Neutro Abs 4.2 1.7 - 7.7 K/uL   Lymphocytes Relative 6 (L) 12 - 46 %   Lymphs Abs 0.3 (L) 0.7 - 4.0 K/uL   Monocytes Relative 8 3 - 12 %   Monocytes Absolute 0.4 0.1 - 1.0 K/uL   Eosinophils Relative 1 0 - 5 %   Eosinophils Absolute 0.1 0.0 - 0.7 K/uL   Basophils Relative 0 0 - 1 %   Basophils Absolute 0.0 0.0 - 0.1 K/uL  Protime-INR     Status: Abnormal   Collection Time: 08/22/14  7:50 PM  Result Value Ref Range   Prothrombin Time 34.6 (H) 11.6 - 15.2  seconds   INR 3.40 (H) 0.00 - 1.49  Type and screen     Status: None   Collection Time: 08/22/14  7:50 PM  Result Value Ref Range   ABO/RH(D) A POS    Antibody Screen NEG    Sample Expiration 08/25/2014   ABO/Rh     Status: None   Collection Time: 08/22/14  7:56 PM  Result Value Ref Range   ABO/RH(D) A POS    Dg Hip Complete Right  08/22/2014   CLINICAL DATA:  Right hip pain post fall, slipped on tiled floor  EXAM: RIGHT HIP - COMPLETE 2+ VIEW  COMPARISON:  None.  FINDINGS: Three views of the right hip submitted. Study is limited by diffuse osteopenia. No displaced fracture or subluxation. There is a vague lucent line right femoral neck. Subtle lucent line in the region of greater femoral trochanter. Subtle nondisplaced proximal femur fracture cannot be excluded. Clinical correlation is necessary. Further correlation with CT or MRI could be performed as clinically warranted.  IMPRESSION: Study is limited by diffuse osteopenia. No displaced fracture or subluxation. There is a vague lucent line right femoral neck. Subtle lucent line in the region of greater femoral trochanter. Subtle nondisplaced proximal femur fracture cannot be excluded. Clinical correlation is necessary. Further correlation with CT or MRI could be performed as clinically warranted.   Electronically Signed   By: Lahoma Crocker M.D.   On: 08/22/2014 20:05   Ct Head Wo Contrast  08/22/2014   CLINICAL DATA:  FALL Comments: PT FELL AT HOME TODAY SLIPPING ON FLOOR. PT STATES HE HIT THE BACK OF HIS HEAD. PT IS ON BLOOD THINNERS  EXAM: CT HEAD WITHOUT CONTRAST  CT CERVICAL SPINE WITHOUT CONTRAST  TECHNIQUE: Multidetector CT imaging of the head and cervical spine was performed following the standard protocol without intravenous contrast. Multiplanar CT image reconstructions of the cervical spine were also generated.  COMPARISON:  06/27/2014  FINDINGS: CT HEAD FINDINGS  The ventricles, cisterns and other CSF spaces are within normal. There is  mild chronic ischemic microvascular disease. There is no mass, mass effect, shift of midline structures or acute hemorrhage. There is no evidence of acute infarction. There is no evidence of fracture. No significant soft tissue injury.  CT CERVICAL SPINE FINDINGS  Vertebral body alignment, heights and disc space heights are within normal. There is mild to moderate spondylosis present throughout the cervical spine. Prevertebral soft tissues are normal. Atlantoaxial articulation is within normal. There is uncovertebral joint spurring and facet arthropathy. Facet arthropathy is most prominent on the left at the C2-3 level. There is no acute fracture or subluxation. Remaining soft tissue structures are unremarkable.  IMPRESSION: No acute intracranial findings.  No acute cervical spine injury.  Mild chronic ischemic microvascular disease.  Mild to moderate spondylosis of the cervical spine.   Electronically Signed   By: Marin Olp M.D.   On: 08/22/2014 19:58   Ct Cervical Spine Wo Contrast  08/22/2014   CLINICAL DATA:  FALL Comments: PT FELL AT HOME TODAY SLIPPING ON FLOOR. PT STATES HE HIT THE BACK OF HIS HEAD. PT IS ON BLOOD THINNERS  EXAM: CT HEAD WITHOUT CONTRAST  CT CERVICAL SPINE WITHOUT CONTRAST  TECHNIQUE: Multidetector CT imaging of the head and cervical spine was performed following the standard protocol without intravenous contrast. Multiplanar CT image reconstructions of the cervical spine were also generated.  COMPARISON:  06/27/2014  FINDINGS: CT HEAD FINDINGS  The ventricles, cisterns and other CSF spaces are within normal. There is mild chronic ischemic microvascular disease. There is no mass, mass effect, shift of midline structures or acute hemorrhage. There is no evidence of acute infarction. There is no evidence of fracture. No significant soft tissue injury.  CT CERVICAL SPINE FINDINGS  Vertebral body alignment, heights and disc space heights are within normal. There is mild to moderate  spondylosis present throughout the cervical spine. Prevertebral soft tissues are normal. Atlantoaxial articulation is within normal. There is uncovertebral joint spurring and facet arthropathy. Facet arthropathy is most prominent on the left at the C2-3 level. There is no acute fracture or subluxation. Remaining soft tissue structures are unremarkable.  IMPRESSION: No acute intracranial findings.  No acute cervical spine injury.  Mild chronic ischemic microvascular disease.  Mild to moderate spondylosis of the cervical spine.   Electronically Signed   By: Marin Olp M.D.   On: 08/22/2014 19:58   Ct Pelvis Wo Contrast  08/22/2014   CLINICAL DATA:  Status post fall, right pelvic pain  EXAM: CT PELVIS WITHOUT CONTRAST  TECHNIQUE: Multidetector CT imaging of the pelvis was performed following the standard protocol without intravenous contrast.  COMPARISON:  None.  FINDINGS: There is a comminuted nondisplaced right intertrochanteric fracture. There is a fracture cleft which involves the base of the right femoral neck.  There is no other fracture or dislocation.  There are mild broad-based disc bulges at L4-5 and L5-S1. There are mild degenerative changes of bilateral SI joints. There is no focal muscle abnormality. There is no hematoma, soft tissue mass or fluid collection.  There is a small fat containing umbilical hernia. There is bladder wall thickening with mild pericystic inflammatory changes as can be seen with cystitis versus postradiation changes if this area was included in the radiation field.  IMPRESSION: 1. Comminuted, nondisplaced right intertrochanteric fracture. There is a fracture cleft which involves the base of the right femoral neck.   Electronically Signed   By: Kathreen Devoid   On: 08/22/2014 21:44    Review of Systems  Constitutional: Negative for fever, chills, weight loss, malaise/fatigue and diaphoresis.  HENT: Negative for congestion, ear discharge, ear pain, hearing loss, nosebleeds,  sore throat and tinnitus.   Eyes: Negative for blurred vision, double vision, photophobia, pain, discharge and redness.  Respiratory:  Negative for cough, hemoptysis, sputum production, shortness of breath, wheezing and stridor.   Cardiovascular: Negative for chest pain, palpitations, orthopnea, claudication, leg swelling and PND.  Gastrointestinal: Negative for heartburn, nausea, vomiting, abdominal pain, diarrhea, constipation, blood in stool and melena.  Genitourinary: Negative for dysuria, urgency, frequency, hematuria and flank pain.  Musculoskeletal: Positive for joint pain. Negative for myalgias, back pain, falls and neck pain.  Skin: Negative for itching and rash.  Neurological: Negative for dizziness, tingling, tremors, sensory change, speech change, focal weakness, seizures, loss of consciousness, weakness and headaches.  Endo/Heme/Allergies: Negative for environmental allergies and polydipsia. Does not bruise/bleed easily.  Psychiatric/Behavioral: Negative for depression, suicidal ideas, hallucinations, memory loss and substance abuse. The patient is not nervous/anxious and does not have insomnia.     Blood pressure 141/79, pulse 70, temperature 98.1 F (36.7 C), temperature source Oral, resp. rate 16, SpO2 100 %. Physical Exam  Constitutional: He is oriented to person, place, and time. He appears well-developed and well-nourished.  HENT:  Head: Normocephalic and atraumatic.  Eyes: Conjunctivae and EOM are normal. Pupils are equal, round, and reactive to light. Right eye exhibits no discharge. Left eye exhibits no discharge.  Neck: Normal range of motion. Neck supple. No JVD present. No tracheal deviation present. No thyromegaly present.  Cardiovascular: Normal rate and regular rhythm.  Exam reveals no gallop and no friction rub.   No murmur heard. Respiratory: Effort normal and breath sounds normal. No stridor. No respiratory distress. He has no wheezes. He has no rales.  GI: Soft.  Bowel sounds are normal. He exhibits no distension. There is no tenderness. There is no rebound and no guarding.  obese  Musculoskeletal: Normal range of motion. He exhibits no edema or tenderness.  Pain with abduction of the right hip  Lymphadenopathy:    He has no cervical adenopathy.  Neurological: He is alert and oriented to person, place, and time. He has normal reflexes. He displays normal reflexes. No cranial nerve deficit. He exhibits normal muscle tone. Coordination normal.  Skin: Skin is warm and dry. No rash noted. No erythema. No pallor.  Psychiatric: He has a normal mood and affect. His behavior is normal. Judgment and thought content normal.     Assessment/Plan R hip intertrochanteric fracture NPO after midnite Pain control with dilaudid Pt is probably moderate risk, but benefits of surgery outweight the risk and pt is medically cleared to have surgery at this time.  Orthopedic consult much appreciated  Presumed osteoporosis: Check vitamin d, tsh, magnesium, phos Bone density as outpatient  Coagulopathy Hold coumadin,  Check INR in am Vitamin K 2.60m po x1  Hyperglycemia Check hga1c,  If elevated please initiate finger stick bs, and insulin sensitive sliding scale  Anemia Renal insufficiency, CKD stage 3: stable Check cmp in am  Thrombocytopenia Check cbc in am  Pafib: Hold coumadin for now, due to pending surgery  CHF (EF 40-45%) Cont torsemide cont losartan, cont carvedilol   Sheree Lalla 08/22/2014, 10:57 PM

## 2014-08-23 ENCOUNTER — Inpatient Hospital Stay (HOSPITAL_COMMUNITY): Payer: Medicare Other

## 2014-08-23 DIAGNOSIS — E1129 Type 2 diabetes mellitus with other diabetic kidney complication: Secondary | ICD-10-CM

## 2014-08-23 DIAGNOSIS — S72141S Displaced intertrochanteric fracture of right femur, sequela: Secondary | ICD-10-CM

## 2014-08-23 LAB — CBC WITH DIFFERENTIAL/PLATELET
BASOS PCT: 0 % (ref 0–1)
Basophils Absolute: 0 10*3/uL (ref 0.0–0.1)
Eosinophils Absolute: 0 10*3/uL (ref 0.0–0.7)
Eosinophils Relative: 1 % (ref 0–5)
HEMATOCRIT: 30.5 % — AB (ref 39.0–52.0)
HEMOGLOBIN: 10.7 g/dL — AB (ref 13.0–17.0)
LYMPHS PCT: 15 % (ref 12–46)
Lymphs Abs: 0.8 10*3/uL (ref 0.7–4.0)
MCH: 33.4 pg (ref 26.0–34.0)
MCHC: 35.1 g/dL (ref 30.0–36.0)
MCV: 95.3 fL (ref 78.0–100.0)
MONO ABS: 0.4 10*3/uL (ref 0.1–1.0)
MONOS PCT: 7 % (ref 3–12)
NEUTROS PCT: 77 % (ref 43–77)
Neutro Abs: 4 10*3/uL (ref 1.7–7.7)
Platelets: 90 10*3/uL — ABNORMAL LOW (ref 150–400)
RBC: 3.2 MIL/uL — AB (ref 4.22–5.81)
RDW: 13.5 % (ref 11.5–15.5)
WBC: 5.2 10*3/uL (ref 4.0–10.5)

## 2014-08-23 LAB — COMPREHENSIVE METABOLIC PANEL
ALBUMIN: 3.2 g/dL — AB (ref 3.5–5.2)
ALK PHOS: 67 U/L (ref 39–117)
ALT: 30 U/L (ref 0–53)
AST: 40 U/L — AB (ref 0–37)
Anion gap: 13 (ref 5–15)
BUN: 48 mg/dL — AB (ref 6–23)
CO2: 24 meq/L (ref 19–32)
CREATININE: 1.73 mg/dL — AB (ref 0.50–1.35)
Calcium: 8.7 mg/dL (ref 8.4–10.5)
Chloride: 101 mEq/L (ref 96–112)
GFR calc Af Amer: 42 mL/min — ABNORMAL LOW (ref >90)
GFR calc non Af Amer: 37 mL/min — ABNORMAL LOW (ref >90)
Glucose, Bld: 211 mg/dL — ABNORMAL HIGH (ref 70–99)
POTASSIUM: 4.1 meq/L (ref 3.7–5.3)
Sodium: 138 mEq/L (ref 137–147)
Total Bilirubin: 1 mg/dL (ref 0.3–1.2)
Total Protein: 6.9 g/dL (ref 6.0–8.3)

## 2014-08-23 LAB — GLUCOSE, CAPILLARY
GLUCOSE-CAPILLARY: 201 mg/dL — AB (ref 70–99)
GLUCOSE-CAPILLARY: 181 mg/dL — AB (ref 70–99)
Glucose-Capillary: 187 mg/dL — ABNORMAL HIGH (ref 70–99)
Glucose-Capillary: 196 mg/dL — ABNORMAL HIGH (ref 70–99)

## 2014-08-23 LAB — TSH: TSH: 0.931 u[IU]/mL (ref 0.350–4.500)

## 2014-08-23 LAB — MAGNESIUM: Magnesium: 1.8 mg/dL (ref 1.5–2.5)

## 2014-08-23 LAB — VITAMIN D 25 HYDROXY (VIT D DEFICIENCY, FRACTURES): Vit D, 25-Hydroxy: 29 ng/mL — ABNORMAL LOW (ref 30–89)

## 2014-08-23 LAB — PHOSPHORUS: Phosphorus: 4.5 mg/dL (ref 2.3–4.6)

## 2014-08-23 LAB — PROTIME-INR
INR: 3.51 — ABNORMAL HIGH (ref 0.00–1.49)
PROTHROMBIN TIME: 35.5 s — AB (ref 11.6–15.2)

## 2014-08-23 LAB — HEMOGLOBIN A1C
HEMOGLOBIN A1C: 6.5 % — AB (ref <5.7)
Mean Plasma Glucose: 140 mg/dL — ABNORMAL HIGH (ref <117)

## 2014-08-23 MED ORDER — VITAMINS A & D EX OINT
TOPICAL_OINTMENT | CUTANEOUS | Status: AC
Start: 2014-08-23 — End: 2014-08-23
  Administered 2014-08-23: 5
  Filled 2014-08-23: qty 5

## 2014-08-23 MED ORDER — PHYTONADIONE 5 MG PO TABS
2.5000 mg | ORAL_TABLET | Freq: Once | ORAL | Status: AC
  Administered 2014-08-23: 2.5 mg via ORAL
  Filled 2014-08-23: qty 1

## 2014-08-23 NOTE — Progress Notes (Signed)
Pt. Transferred to [facility] center.  Report given to nurse, went over all medications that have been given during my shift, including all pain medications.  Report given to [place] transport nurse.  All papers signed and printed.  Telemetry taken off and put on carelink monitor.  IV was not taken out due to transfering to another hospital.  VSS.  All questions answered from pt and wife.  Wheeled out on a stretcher by carelink.

## 2014-08-23 NOTE — Progress Notes (Signed)
Pt's wife, [name] called to speak to the Charge Nurse about concerns for her husbands plan of care that was discussed with her by Dr. Gladstone Lighter. Charge RN spoke with Dr. Gladstone Lighter and he stated that the reason he suggested pt be transferred to [facility] was for cardiac safety and d/t full surgical schedule. This plan was discussed with the Hospitalists and Dr. Gladstone Lighter also spoke with Hospitalist to discuss the plan. Pt's wife was called back to inform her of the plan and she asked that no decisions be made until she arrives to hospital. Note left for MD on chart. MD still to round on pt and make final decision.   Othella Boyer [facility] 08/23/2014

## 2014-08-23 NOTE — Plan of Care (Signed)
Problem: Phase I Progression Outcomes Goal: Pain controlled with appropriate interventions Outcome: Progressing     

## 2014-08-23 NOTE — Consult Note (Signed)
Reason for Consult:Fracture of his Right Hip Referring Physician: DR.Vega  Cameron Acosta is an 78 y.o. male.  HPI: [place] and injured his Right Hip. He has a history of Heart Disease and is on Coumadin.  Past Medical History  Diagnosis Date  . Hypertension   . Cancer   . Atrial fibrillation   . Pacemaker   . History of prostate cancer 06/26/2014  . AICD (automatic cardioverter/defibrillator) present 06/26/2014  . Cardiac pacemaker in situ 06/26/2014  . CKD (chronic kidney disease) stage 3, GFR 30-59 ml/min 06/26/2014  . OSA (obstructive sleep apnea) 06/26/2014    on cpap  . NASH (nonalcoholic steatohepatitis) 06/26/2014  . Thrombocytopenia 06/26/2014  . CHF (congestive heart failure)     EF 40-45% on echo 06/26/2014  . Mitral regurgitation     Mild on echo 06/26/2014    Past Surgical History  Procedure Laterality Date  . Ablasion    . Appendectomy    . Cholecystectomy    . Uvulectomy    . Knee arthroscopy Left   . Shoulder surgery Right   . Colonoscopy  2015    polyps (repeat per pt in 2018) Dr. Darden Amber at [place] [facility]      for polyp per pt (Dr. Morton Stall at [facility])    Family History  Problem Relation Age of Onset  . Congestive Heart Failure Father   . Dementia Mother     Social History:  reports that he quit smoking about 31 years ago. His smoking use included Cigarettes. He has a 1.75 pack-year smoking history. He has never used smokeless tobacco. He reports that he drinks about 3.0 oz of alcohol per week. He reports that he does not use illicit drugs.  Allergies: No Known Allergies  Medications: I have reviewed the patient's current medications.  Results for orders placed or performed during the hospital encounter of 08/22/14 (from the past 48 hour(s))  Basic metabolic panel     Status: Abnormal   Collection Time: 08/22/14  7:50 PM  Result Value Ref Range   Sodium 132 (L) 137 - 147 mEq/L   Potassium 3.9 3.7 - 5.3 mEq/L   Chloride 94 (L) 96 - 112 mEq/L    CO2 24 19 - 32 mEq/L   Glucose, Bld 178 (H) 70 - 99 mg/dL   BUN 43 (H) 6 - 23 mg/dL   Creatinine, Ser 1.48 (H) 0.50 - 1.35 mg/dL   Calcium 8.8 8.4 - 10.5 mg/dL   GFR calc non Af Amer 44 (L) >90 mL/min   GFR calc Af Amer 51 (L) >90 mL/min    Comment: (NOTE) The eGFR has been calculated using the CKD EPI equation. This calculation has not been validated in all clinical situations. eGFR's persistently <90 mL/min signify possible Chronic Kidney Disease.    Anion gap 14 5 - 15  CBC WITH DIFFERENTIAL     Status: Abnormal   Collection Time: 08/22/14  7:50 PM  Result Value Ref Range   WBC 4.9 4.0 - 10.5 K/uL   RBC 3.60 (L) 4.22 - 5.81 MIL/uL   Hemoglobin 12.0 (L) 13.0 - 17.0 g/dL   HCT 34.4 (L) 39.0 - 52.0 %   MCV 95.6 78.0 - 100.0 fL   MCH 33.3 26.0 - 34.0 pg   MCHC 34.9 30.0 - 36.0 g/dL   RDW 13.4 11.5 - 15.5 %   Platelets 86 (L) 150 - 400 K/uL    Comment: SPECIMEN CHECKED FOR CLOTS REPEATED TO VERIFY PLATELET COUNT  CONFIRMED BY SMEAR LARGE PLATELETS PRESENT    Neutrophils Relative % 85 (H) 43 - 77 %   Neutro Abs 4.2 1.7 - 7.7 K/uL   Lymphocytes Relative 6 (L) 12 - 46 %   Lymphs Abs 0.3 (L) 0.7 - 4.0 K/uL   Monocytes Relative 8 3 - 12 %   Monocytes Absolute 0.4 0.1 - 1.0 K/uL   Eosinophils Relative 1 0 - 5 %   Eosinophils Absolute 0.1 0.0 - 0.7 K/uL   Basophils Relative 0 0 - 1 %   Basophils Absolute 0.0 0.0 - 0.1 K/uL  Protime-INR     Status: Abnormal   Collection Time: 08/22/14  7:50 PM  Result Value Ref Range   Prothrombin Time 34.6 (H) 11.6 - 15.2 seconds   INR 3.40 (H) 0.00 - 1.49  Type and screen     Status: None   Collection Time: 08/22/14  7:50 PM  Result Value Ref Range   ABO/RH(D) A POS    Antibody Screen NEG    Sample Expiration 08/25/2014   ABO/Rh     Status: None   Collection Time: 08/22/14  7:56 PM  Result Value Ref Range   ABO/RH(D) A POS   Glucose, capillary     Status: Abnormal   Collection Time: 08/23/14 12:31 AM  Result Value Ref Range    Glucose-Capillary 196 (H) 70 - 99 mg/dL  Glucose, capillary     Status: Abnormal   Collection Time: 08/23/14  4:18 AM  Result Value Ref Range   Glucose-Capillary 201 (H) 70 - 99 mg/dL  TSH     Status: None   Collection Time: 08/23/14  4:29 AM  Result Value Ref Range   TSH 0.931 0.350 - 4.500 uIU/mL    Comment: Performed at [facility]  Magnesium     Status: None   Collection Time: 08/23/14  4:29 AM  Result Value Ref Range   Magnesium 1.8 1.5 - 2.5 mg/dL  Phosphorus     Status: None   Collection Time: 08/23/14  4:29 AM  Result Value Ref Range   Phosphorus 4.5 2.3 - 4.6 mg/dL  CBC with Differential     Status: Abnormal   Collection Time: 08/23/14  4:29 AM  Result Value Ref Range   WBC 5.2 4.0 - 10.5 K/uL   RBC 3.20 (L) 4.22 - 5.81 MIL/uL   Hemoglobin 10.7 (L) 13.0 - 17.0 g/dL   HCT 30.5 (L) 39.0 - 52.0 %   MCV 95.3 78.0 - 100.0 fL   MCH 33.4 26.0 - 34.0 pg   MCHC 35.1 30.0 - 36.0 g/dL   RDW 13.5 11.5 - 15.5 %   Platelets 90 (L) 150 - 400 K/uL    Comment: REPEATED TO VERIFY SPECIMEN CHECKED FOR CLOTS CONSISTENT WITH PREVIOUS RESULT    Neutrophils Relative % 77 43 - 77 %   Neutro Abs 4.0 1.7 - 7.7 K/uL   Lymphocytes Relative 15 12 - 46 %   Lymphs Abs 0.8 0.7 - 4.0 K/uL   Monocytes Relative 7 3 - 12 %   Monocytes Absolute 0.4 0.1 - 1.0 K/uL   Eosinophils Relative 1 0 - 5 %   Eosinophils Absolute 0.0 0.0 - 0.7 K/uL   Basophils Relative 0 0 - 1 %   Basophils Absolute 0.0 0.0 - 0.1 K/uL  Comprehensive metabolic panel     Status: Abnormal   Collection Time: 08/23/14  4:29 AM  Result Value Ref Range   Sodium 138 137 -  147 mEq/L   Potassium 4.1 3.7 - 5.3 mEq/L   Chloride 101 96 - 112 mEq/L   CO2 24 19 - 32 mEq/L   Glucose, Bld 211 (H) 70 - 99 mg/dL   BUN 48 (H) 6 - 23 mg/dL   Creatinine, Ser 1.73 (H) 0.50 - 1.35 mg/dL   Calcium 8.7 8.4 - 10.5 mg/dL   Total Protein 6.9 6.0 - 8.3 g/dL   Albumin 3.2 (L) 3.5 - 5.2 g/dL   AST 40 (H) 0 - 37 U/L   ALT 30 0 - 53 U/L    Alkaline Phosphatase 67 39 - 117 U/L   Total Bilirubin 1.0 0.3 - 1.2 mg/dL   GFR calc non Af Amer 37 (L) >90 mL/min   GFR calc Af Amer 42 (L) >90 mL/min    Comment: (NOTE) The eGFR has been calculated using the CKD EPI equation. This calculation has not been validated in all clinical situations. eGFR's persistently <90 mL/min signify possible Chronic Kidney Disease.    Anion gap 13 5 - 15  Protime-INR     Status: Abnormal   Collection Time: 08/23/14  4:29 AM  Result Value Ref Range   Prothrombin Time 35.5 (H) 11.6 - 15.2 seconds   INR 3.51 (H) 0.00 - 1.49    Dg Hip Complete Right  08/22/2014   CLINICAL DATA:  Right hip pain post fall, slipped on tiled floor  EXAM: RIGHT HIP - COMPLETE 2+ VIEW  COMPARISON:  None.  FINDINGS: Three views of the right hip submitted. Study is limited by diffuse osteopenia. No displaced fracture or subluxation. There is a vague lucent line right femoral neck. Subtle lucent line in the region of greater femoral trochanter. Subtle nondisplaced proximal femur fracture cannot be excluded. Clinical correlation is necessary. Further correlation with CT or MRI could be performed as clinically warranted.  IMPRESSION: Study is limited by diffuse osteopenia. No displaced fracture or subluxation. There is a vague lucent line right femoral neck. Subtle lucent line in the region of greater femoral trochanter. Subtle nondisplaced proximal femur fracture cannot be excluded. Clinical correlation is necessary. Further correlation with CT or MRI could be performed as clinically warranted.   Electronically Signed   By: Lahoma Crocker M.D.   On: 08/22/2014 20:05   Ct Head Wo Contrast  08/22/2014   CLINICAL DATA:  FALL Comments: PT FELL AT HOME TODAY SLIPPING ON FLOOR. PT STATES HE HIT THE BACK OF HIS HEAD. PT IS ON BLOOD THINNERS  EXAM: CT HEAD WITHOUT CONTRAST  CT CERVICAL SPINE WITHOUT CONTRAST  TECHNIQUE: Multidetector CT imaging of the head and cervical spine was performed following  the standard protocol without intravenous contrast. Multiplanar CT image reconstructions of the cervical spine were also generated.  COMPARISON:  06/27/2014  FINDINGS: CT HEAD FINDINGS  The ventricles, cisterns and other CSF spaces are within normal. There is mild chronic ischemic microvascular disease. There is no mass, mass effect, shift of midline structures or acute hemorrhage. There is no evidence of acute infarction. There is no evidence of fracture. No significant soft tissue injury.  CT CERVICAL SPINE FINDINGS  Vertebral body alignment, heights and disc space heights are within normal. There is mild to moderate spondylosis present throughout the cervical spine. Prevertebral soft tissues are normal. Atlantoaxial articulation is within normal. There is uncovertebral joint spurring and facet arthropathy. Facet arthropathy is most prominent on the left at the C2-3 level. There is no acute fracture or subluxation. Remaining soft tissue structures are unremarkable.  IMPRESSION: No acute intracranial findings.  No acute cervical spine injury.  Mild chronic ischemic microvascular disease.  Mild to moderate spondylosis of the cervical spine.   Electronically Signed   By: Marin Olp M.D.   On: 08/22/2014 19:58   Ct Cervical Spine Wo Contrast  08/22/2014   CLINICAL DATA:  FALL Comments: PT FELL AT HOME TODAY SLIPPING ON FLOOR. PT STATES HE HIT THE BACK OF HIS HEAD. PT IS ON BLOOD THINNERS  EXAM: CT HEAD WITHOUT CONTRAST  CT CERVICAL SPINE WITHOUT CONTRAST  TECHNIQUE: Multidetector CT imaging of the head and cervical spine was performed following the standard protocol without intravenous contrast. Multiplanar CT image reconstructions of the cervical spine were also generated.  COMPARISON:  06/27/2014  FINDINGS: CT HEAD FINDINGS  The ventricles, cisterns and other CSF spaces are within normal. There is mild chronic ischemic microvascular disease. There is no mass, mass effect, shift of midline structures or acute  hemorrhage. There is no evidence of acute infarction. There is no evidence of fracture. No significant soft tissue injury.  CT CERVICAL SPINE FINDINGS  Vertebral body alignment, heights and disc space heights are within normal. There is mild to moderate spondylosis present throughout the cervical spine. Prevertebral soft tissues are normal. Atlantoaxial articulation is within normal. There is uncovertebral joint spurring and facet arthropathy. Facet arthropathy is most prominent on the left at the C2-3 level. There is no acute fracture or subluxation. Remaining soft tissue structures are unremarkable.  IMPRESSION: No acute intracranial findings.  No acute cervical spine injury.  Mild chronic ischemic microvascular disease.  Mild to moderate spondylosis of the cervical spine.   Electronically Signed   By: Marin Olp M.D.   On: 08/22/2014 19:58   Ct Pelvis Wo Contrast  08/22/2014   CLINICAL DATA:  Status post fall, right pelvic pain  EXAM: CT PELVIS WITHOUT CONTRAST  TECHNIQUE: Multidetector CT imaging of the pelvis was performed following the standard protocol without intravenous contrast.  COMPARISON:  None.  FINDINGS: There is a comminuted nondisplaced right intertrochanteric fracture. There is a fracture cleft which involves the base of the right femoral neck.  There is no other fracture or dislocation.  There are mild broad-based disc bulges at L4-5 and L5-S1. There are mild degenerative changes of bilateral SI joints. There is no focal muscle abnormality. There is no hematoma, soft tissue mass or fluid collection.  There is a small fat containing umbilical hernia. There is bladder wall thickening with mild pericystic inflammatory changes as can be seen with cystitis versus postradiation changes if this area was included in the radiation field.  IMPRESSION: 1. Comminuted, nondisplaced right intertrochanteric fracture. There is a fracture cleft which involves the base of the right femoral neck.    Electronically Signed   By: Kathreen Devoid   On: 08/22/2014 21:44    Review of Systems  HENT: Negative.   Eyes: Negative.   Respiratory: Positive for shortness of breath.        Uses CPAP Machine  Cardiovascular:       History of Atrial Fibrillation and is on Coumadin.  Gastrointestinal: Negative.   Genitourinary: Negative.   Musculoskeletal: Positive for joint pain and falls.  Neurological: Positive for weakness.  Psychiatric/Behavioral: Negative.    Blood pressure 118/63, pulse 70, temperature 98.3 F (36.8 C), temperature source Oral, resp. rate 16, height 6' (1.829 m), weight 116.4 kg (256 lb 9.9 oz), SpO2 100 %. Physical Exam  Constitutional: He appears well-developed.  HENT:  Head:  Normocephalic.  Eyes: Pupils are equal, round, and reactive to light.  Neck: Normal range of motion.  Cardiovascular:  Atrial Fibrillation  Respiratory:  Uses CPAP Machine  GI: Soft.  Musculoskeletal:  Non-Displaced Fracture of his Right Hip  Neurological: He is alert.  Skin: Skin is warm.    Assessment/Plan: I discussed his case with his Wife and she informed me that she wants him transferred to [facility] because of his Cardiac isues. I will leave this up to his Hospitalist.  Eyvonne Burchfield A 08/23/2014, 7:46 AM

## 2014-08-23 NOTE — Discharge Summary (Signed)
Physician Discharge Summary  Raequon Catanzaro ONG:[id number] DOB: January 14, 1936 DOA: 08/22/2014  PCP: Liliane Shi, MD  Admit date: 08/22/2014 Discharge date: 08/23/2014  Time spent: >35 minutes  Recommendations for Follow-up:  1. Please continue to monitor INR levels. Pt will need to have lower INR level before operation.  2. Pt has history of systolic CHF and cardiologist works at [employer], consider consulting for preop clearance  Discharge Diagnoses:  Active Problems:   Thrombocytopenia   DM type 2 causing renal disease   Hip fracture   Coagulopathy   Intertrochanteric fracture of right femur   Discharge Condition: stable  Diet recommendation: heart healthy/carb modified  Filed Weights   08/22/14 2343 08/23/14 0421  Weight: 115.5 kg (254 lb 10.1 oz) 116.4 kg (256 lb 9.9 oz)    History of present illness:  From original HPI: 78 yo male with Afib, CHF (EF 40-45%), Mild MR, [place], apparently presented due to mechanical fall in bathroom, Pt was coming out when fell to the side and injured his right hip.  Found to have right intertrochanteric hip fracture on work up.  Hospital Course:  Right intertrochanteric hip fx - Will transfer to [facility] per patient and wife preference given that cardiologist works with [employer]. - Accepting physician Dr. Lenise Herald and orthopaedic surgeon aware - supratherapeutic INR. Will administer Vitamin K 2.5 mg once  DM - Diabetic diet - continue glipizide at this juncture  CKD - recommend monitoring S creatinine - stable  Atrial fibrillation - continue B blocker for rate control - Hold coumadin at this juncture. Will administer Vitamin K 2.5 mg once. Recommend monitoring INR daily  Systolic CHF - Will continue home regimen, - currently compensated.  Procedures: None  Consultations: - Discussed case with Orthopaedic surgeon at [facility] who will see patient in consult (Dr. Thedora Hinders)  Discharge Exam: Filed Vitals:   08/23/14  0709  BP: 118/63  Pulse:   Temp:   Resp:     General: Pt in nad, alert and awake Cardiovascular: s1 and s2 present, no murmurs Respiratory: CTA BL, no wheezes  Discharge Instructions You were cared for by a hospitalist during your hospital stay. If you have any questions about your discharge medications or the care you received while you were in the hospital after you are discharged, you can call the unit and asked to speak with the hospitalist on call if the hospitalist that took care of you is not available. Once you are discharged, your primary care physician will handle any further medical issues. Please note that NO REFILLS for any discharge medications will be authorized once you are discharged, as it is imperative that you return to your primary care physician (or establish a relationship with a primary care physician if you do not have one) for your aftercare needs so that they can reassess your need for medications and monitor your lab values.  Discharge Instructions    Call MD for:  difficulty breathing, headache or visual disturbances    Complete by:  As directed      Call MD for:  severe uncontrolled pain    Complete by:  As directed      Call MD for:  temperature >100.4    Complete by:  As directed      Diet - low sodium heart healthy    Complete by:  As directed      Discharge instructions    Complete by:  As directed   Coumadin will be held in lieu of supra-therapeutic value  and patient requiring operation.     Increase activity slowly    Complete by:  As directed           Current Discharge Medication List    CONTINUE these medications which have NOT CHANGED   Details  carvedilol (COREG) 3.125 MG tablet Take 3.125 mg by mouth 2 (two) times daily with a meal.    glipiZIDE (GLUCOTROL XL) 2.5 MG 24 hr tablet Take 2.5 mg by mouth daily.      hydrALAZINE (APRESOLINE) 25 MG tablet Take 25 mg by mouth 2 (two) times daily.    losartan (COZAAR) 50 MG tablet Take 50 mg  by mouth every evening.      oxyCODONE (OXY IR/ROXICODONE) 5 MG immediate release tablet Take 1 tablet (5 mg total) by mouth every 4 (four) hours as needed for severe pain. Qty: 20 tablet, Refills: 0    rosuvastatin (CRESTOR) 10 MG tablet Take 10 mg by mouth every evening.     sertraline (ZOLOFT) 50 MG tablet Take 50 mg by mouth at bedtime.    torsemide (DEMADEX) 20 MG tablet Take 40mg  once daily for now till seen in follow up by your cardiologist or PCP. Qty: 60 tablet, Refills: 0    zolpidem (AMBIEN) 10 MG tablet Take 10 mg by mouth at bedtime.      cefpodoxime (VANTIN) 100 MG tablet Take 2 tablets (200 mg total) by mouth 2 (two) times daily. For 5 more days Qty: 10 tablet, Refills: 0    omega-3 acid ethyl esters (LOVAZA) 1 G capsule Take 2 g by mouth 2 (two) times daily.        STOP taking these medications     warfarin (COUMADIN) 6 MG tablet      metoprolol (TOPROL-XL) 50 MG 24 hr tablet      UNABLE TO FIND        No Known Allergies    The results of significant diagnostics from this hospitalization (including imaging, microbiology, ancillary and laboratory) are listed below for reference.    Significant Diagnostic Studies: Dg Hip Complete Right  08/22/2014   CLINICAL DATA:  Right hip pain post fall, slipped on tiled floor  EXAM: RIGHT HIP - COMPLETE 2+ VIEW  COMPARISON:  None.  FINDINGS: Three views of the right hip submitted. Study is limited by diffuse osteopenia. No displaced fracture or subluxation. There is a vague lucent line right femoral neck. Subtle lucent line in the region of greater femoral trochanter. Subtle nondisplaced proximal femur fracture cannot be excluded. Clinical correlation is necessary. Further correlation with CT or MRI could be performed as clinically warranted.  IMPRESSION: Study is limited by diffuse osteopenia. No displaced fracture or subluxation. There is a vague lucent line right femoral neck. Subtle lucent line in the region of greater  femoral trochanter. Subtle nondisplaced proximal femur fracture cannot be excluded. Clinical correlation is necessary. Further correlation with CT or MRI could be performed as clinically warranted.   Electronically Signed   By: Lahoma Crocker M.D.   On: 08/22/2014 20:05   Ct Head Wo Contrast  08/22/2014   CLINICAL DATA:  FALL Comments: PT FELL AT HOME TODAY SLIPPING ON FLOOR. PT STATES HE HIT THE BACK OF HIS HEAD. PT IS ON BLOOD THINNERS  EXAM: CT HEAD WITHOUT CONTRAST  CT CERVICAL SPINE WITHOUT CONTRAST  TECHNIQUE: Multidetector CT imaging of the head and cervical spine was performed following the standard protocol without intravenous contrast. Multiplanar CT image reconstructions of the cervical spine  were also generated.  COMPARISON:  06/27/2014  FINDINGS: CT HEAD FINDINGS  The ventricles, cisterns and other CSF spaces are within normal. There is mild chronic ischemic microvascular disease. There is no mass, mass effect, shift of midline structures or acute hemorrhage. There is no evidence of acute infarction. There is no evidence of fracture. No significant soft tissue injury.  CT CERVICAL SPINE FINDINGS  Vertebral body alignment, heights and disc space heights are within normal. There is mild to moderate spondylosis present throughout the cervical spine. Prevertebral soft tissues are normal. Atlantoaxial articulation is within normal. There is uncovertebral joint spurring and facet arthropathy. Facet arthropathy is most prominent on the left at the C2-3 level. There is no acute fracture or subluxation. Remaining soft tissue structures are unremarkable.  IMPRESSION: No acute intracranial findings.  No acute cervical spine injury.  Mild chronic ischemic microvascular disease.  Mild to moderate spondylosis of the cervical spine.   Electronically Signed   By: Marin Olp M.D.   On: 08/22/2014 19:58   Ct Cervical Spine Wo Contrast  08/22/2014   CLINICAL DATA:  FALL Comments: PT FELL AT HOME TODAY SLIPPING ON  FLOOR. PT STATES HE HIT THE BACK OF HIS HEAD. PT IS ON BLOOD THINNERS  EXAM: CT HEAD WITHOUT CONTRAST  CT CERVICAL SPINE WITHOUT CONTRAST  TECHNIQUE: Multidetector CT imaging of the head and cervical spine was performed following the standard protocol without intravenous contrast. Multiplanar CT image reconstructions of the cervical spine were also generated.  COMPARISON:  06/27/2014  FINDINGS: CT HEAD FINDINGS  The ventricles, cisterns and other CSF spaces are within normal. There is mild chronic ischemic microvascular disease. There is no mass, mass effect, shift of midline structures or acute hemorrhage. There is no evidence of acute infarction. There is no evidence of fracture. No significant soft tissue injury.  CT CERVICAL SPINE FINDINGS  Vertebral body alignment, heights and disc space heights are within normal. There is mild to moderate spondylosis present throughout the cervical spine. Prevertebral soft tissues are normal. Atlantoaxial articulation is within normal. There is uncovertebral joint spurring and facet arthropathy. Facet arthropathy is most prominent on the left at the C2-3 level. There is no acute fracture or subluxation. Remaining soft tissue structures are unremarkable.  IMPRESSION: No acute intracranial findings.  No acute cervical spine injury.  Mild chronic ischemic microvascular disease.  Mild to moderate spondylosis of the cervical spine.   Electronically Signed   By: Marin Olp M.D.   On: 08/22/2014 19:58   Ct Pelvis Wo Contrast  08/22/2014   CLINICAL DATA:  Status post fall, right pelvic pain  EXAM: CT PELVIS WITHOUT CONTRAST  TECHNIQUE: Multidetector CT imaging of the pelvis was performed following the standard protocol without intravenous contrast.  COMPARISON:  None.  FINDINGS: There is a comminuted nondisplaced right intertrochanteric fracture. There is a fracture cleft which involves the base of the right femoral neck.  There is no other fracture or dislocation.  There are  mild broad-based disc bulges at L4-5 and L5-S1. There are mild degenerative changes of bilateral SI joints. There is no focal muscle abnormality. There is no hematoma, soft tissue mass or fluid collection.  There is a small fat containing umbilical hernia. There is bladder wall thickening with mild pericystic inflammatory changes as can be seen with cystitis versus postradiation changes if this area was included in the radiation field.  IMPRESSION: 1. Comminuted, nondisplaced right intertrochanteric fracture. There is a fracture cleft which involves the base of the  right femoral neck.   Electronically Signed   By: Kathreen Devoid   On: 08/22/2014 21:44    Microbiology: No results found for this or any previous visit (from the past 240 hour(s)).   Labs: Basic Metabolic Panel:  Recent Labs Lab 08/22/14 1950 08/23/14 0429  NA 132* 138  K 3.9 4.1  CL 94* 101  CO2 24 24  GLUCOSE 178* 211*  BUN 43* 48*  CREATININE 1.48* 1.73*  CALCIUM 8.8 8.7  MG  --  1.8  PHOS  --  4.5   Liver Function Tests:  Recent Labs Lab 08/23/14 0429  AST 40*  ALT 30  ALKPHOS 67  BILITOT 1.0  PROT 6.9  ALBUMIN 3.2*   No results for input(s): LIPASE, AMYLASE in the last 168 hours. No results for input(s): AMMONIA in the last 168 hours. CBC:  Recent Labs Lab 08/22/14 1950 08/23/14 0429  WBC 4.9 5.2  NEUTROABS 4.2 4.0  HGB 12.0* 10.7*  HCT 34.4* 30.5*  MCV 95.6 95.3  PLT 86* 90*   Cardiac Enzymes: No results for input(s): CKTOTAL, CKMB, CKMBINDEX, TROPONINI in the last 168 hours. BNP: BNP (last 3 results) No results for input(s): PROBNP in the last 8760 hours. CBG:  Recent Labs Lab 08/23/14 0031 08/23/14 0418 08/23/14 0751  GLUCAP 196* 201* 181*       Signed:  Velvet Bathe  [facility] 08/23/2014, 11:28 AM

## 2014-08-23 NOTE — Plan of Care (Signed)
Problem: Phase III Progression Outcomes Goal: Foley discontinued Outcome: Not Applicable Date Met:  77/41/42

## 2014-08-24 LAB — PARATHYROID HORMONE, INTACT (NO CA): PTH: 242 pg/mL — AB (ref 14–64)

## 2014-08-26 LAB — VITAMIN D 1,25 DIHYDROXY
VITAMIN D 1, 25 (OH) TOTAL: 30 pg/mL (ref 18–72)
VITAMIN D3 1, 25 (OH): 30 pg/mL

## 2016-05-22 ENCOUNTER — Emergency Department (HOSPITAL_COMMUNITY): Payer: Medicare Other

## 2016-05-22 ENCOUNTER — Encounter (HOSPITAL_COMMUNITY): Payer: Self-pay | Admitting: Emergency Medicine

## 2016-05-22 ENCOUNTER — Inpatient Hospital Stay (HOSPITAL_COMMUNITY)
Admission: EM | Admit: 2016-05-22 | Discharge: 2016-05-26 | DRG: 536 | Disposition: A | Discharge status: Home Health | Payer: Medicare Other | Attending: Internal Medicine | Admitting: Internal Medicine

## 2016-05-22 DIAGNOSIS — L8990 Pressure ulcer of unspecified site, unspecified stage: Secondary | ICD-10-CM | POA: Insufficient documentation

## 2016-05-22 DIAGNOSIS — I130 Hypertensive heart and chronic kidney disease with heart failure and stage 1 through stage 4 chronic kidney disease, or unspecified chronic kidney disease: Secondary | ICD-10-CM | POA: Diagnosis present

## 2016-05-22 DIAGNOSIS — G4733 Obstructive sleep apnea (adult) (pediatric): Secondary | ICD-10-CM | POA: Diagnosis present

## 2016-05-22 DIAGNOSIS — I4891 Unspecified atrial fibrillation: Secondary | ICD-10-CM | POA: Diagnosis present

## 2016-05-22 DIAGNOSIS — E1129 Type 2 diabetes mellitus with other diabetic kidney complication: Secondary | ICD-10-CM | POA: Diagnosis present

## 2016-05-22 DIAGNOSIS — Z95810 Presence of automatic (implantable) cardiac defibrillator: Secondary | ICD-10-CM

## 2016-05-22 DIAGNOSIS — I340 Nonrheumatic mitral (valve) insufficiency: Secondary | ICD-10-CM | POA: Diagnosis present

## 2016-05-22 DIAGNOSIS — N183 Chronic kidney disease, stage 3 unspecified: Secondary | ICD-10-CM | POA: Diagnosis present

## 2016-05-22 DIAGNOSIS — W19XXXA Unspecified fall, initial encounter: Secondary | ICD-10-CM | POA: Diagnosis present

## 2016-05-22 DIAGNOSIS — Z66 Do not resuscitate: Secondary | ICD-10-CM | POA: Diagnosis present

## 2016-05-22 DIAGNOSIS — K7581 Nonalcoholic steatohepatitis (NASH): Secondary | ICD-10-CM | POA: Diagnosis present

## 2016-05-22 DIAGNOSIS — E1122 Type 2 diabetes mellitus with diabetic chronic kidney disease: Secondary | ICD-10-CM | POA: Diagnosis present

## 2016-05-22 DIAGNOSIS — Z87891 Personal history of nicotine dependence: Secondary | ICD-10-CM

## 2016-05-22 DIAGNOSIS — N390 Urinary tract infection, site not specified: Secondary | ICD-10-CM | POA: Diagnosis present

## 2016-05-22 DIAGNOSIS — Z8546 Personal history of malignant neoplasm of prostate: Secondary | ICD-10-CM

## 2016-05-22 DIAGNOSIS — R410 Disorientation, unspecified: Secondary | ICD-10-CM | POA: Diagnosis present

## 2016-05-22 DIAGNOSIS — L89311 Pressure ulcer of right buttock, stage 1: Secondary | ICD-10-CM | POA: Diagnosis present

## 2016-05-22 DIAGNOSIS — S72101A Unspecified trochanteric fracture of right femur, initial encounter for closed fracture: Secondary | ICD-10-CM

## 2016-05-22 DIAGNOSIS — S72114A Nondisplaced fracture of greater trochanter of right femur, initial encounter for closed fracture: Principal | ICD-10-CM | POA: Diagnosis present

## 2016-05-22 DIAGNOSIS — Z8551 Personal history of malignant neoplasm of bladder: Secondary | ICD-10-CM

## 2016-05-22 DIAGNOSIS — Z7984 Long term (current) use of oral hypoglycemic drugs: Secondary | ICD-10-CM

## 2016-05-22 LAB — COMPREHENSIVE METABOLIC PANEL
ALBUMIN: 3.4 g/dL — AB (ref 3.5–5.0)
ALK PHOS: 102 U/L (ref 38–126)
ALT: 21 U/L (ref 17–63)
AST: 27 U/L (ref 15–41)
Anion gap: 9 (ref 5–15)
BILIRUBIN TOTAL: 1.6 mg/dL — AB (ref 0.3–1.2)
BUN: 50 mg/dL — AB (ref 6–20)
CO2: 26 mmol/L (ref 22–32)
Calcium: 9.2 mg/dL (ref 8.9–10.3)
Chloride: 102 mmol/L (ref 101–111)
Creatinine, Ser: 1.95 mg/dL — ABNORMAL HIGH (ref 0.61–1.24)
GFR calc Af Amer: 36 mL/min — ABNORMAL LOW (ref >60)
GFR, EST NON AFRICAN AMERICAN: 31 mL/min — AB (ref >60)
GLUCOSE: 243 mg/dL — AB (ref 65–99)
POTASSIUM: 3.5 mmol/L (ref 3.5–5.1)
Sodium: 137 mmol/L (ref 135–145)
TOTAL PROTEIN: 7.3 g/dL (ref 6.5–8.1)

## 2016-05-22 LAB — CBC WITH DIFFERENTIAL/PLATELET
BASOS ABS: 0 10*3/uL (ref 0.0–0.1)
BASOS PCT: 0 %
Eosinophils Absolute: 0.2 10*3/uL (ref 0.0–0.7)
Eosinophils Relative: 2 %
HEMATOCRIT: 34.3 % — AB (ref 39.0–52.0)
HEMOGLOBIN: 12 g/dL — AB (ref 13.0–17.0)
Lymphocytes Relative: 16 %
Lymphs Abs: 1.2 10*3/uL (ref 0.7–4.0)
MCH: 33.5 pg (ref 26.0–34.0)
MCHC: 35 g/dL (ref 30.0–36.0)
MCV: 95.8 fL (ref 78.0–100.0)
MONOS PCT: 9 %
Monocytes Absolute: 0.7 10*3/uL (ref 0.1–1.0)
NEUTROS ABS: 5.1 10*3/uL (ref 1.7–7.7)
NEUTROS PCT: 73 %
Platelets: 122 10*3/uL — ABNORMAL LOW (ref 150–400)
RBC: 3.58 MIL/uL — ABNORMAL LOW (ref 4.22–5.81)
RDW: 14.1 % (ref 11.5–15.5)
WBC: 7.1 10*3/uL (ref 4.0–10.5)

## 2016-05-22 LAB — TROPONIN I: Troponin I: 0.03 ng/mL (ref <0.03)

## 2016-05-22 LAB — URINALYSIS, ROUTINE W REFLEX MICROSCOPIC
Bilirubin Urine: NEGATIVE
Glucose, UA: NEGATIVE mg/dL
KETONES UR: NEGATIVE mg/dL
NITRITE: NEGATIVE
PROTEIN: 30 mg/dL — AB
Specific Gravity, Urine: 1.018 (ref 1.005–1.030)
pH: 5.5 (ref 5.0–8.0)

## 2016-05-22 LAB — URINE MICROSCOPIC-ADD ON

## 2016-05-22 LAB — PROTIME-INR
INR: 1.29
Prothrombin Time: 16.2 seconds — ABNORMAL HIGH (ref 11.4–15.2)

## 2016-05-22 LAB — AMMONIA: AMMONIA: 23 umol/L (ref 9–35)

## 2016-05-22 MED ORDER — PANTOPRAZOLE SODIUM 40 MG PO TBEC
40.0000 mg | DELAYED_RELEASE_TABLET | Freq: Every day | ORAL | Status: DC
  Administered 2016-05-23 – 2016-05-26 (×4): 40 mg via ORAL
  Filled 2016-05-22 (×4): qty 1

## 2016-05-22 MED ORDER — GABAPENTIN 100 MG PO CAPS: 100.0000 mg | ORAL_CAPSULE | Freq: Every day | ORAL | Status: DC

## 2016-05-22 MED ORDER — METHADONE HCL 5 MG PO TABS
5.0000 mg | ORAL_TABLET | Freq: Two times a day (BID) | ORAL | Status: DC
  Administered 2016-05-23: 5 mg via ORAL
  Filled 2016-05-22: qty 1

## 2016-05-22 MED ORDER — INSULIN ASPART 100 UNIT/ML ~~LOC~~ SOLN
0.0000 [IU] | Freq: Three times a day (TID) | SUBCUTANEOUS | Status: DC
  Administered 2016-05-23 – 2016-05-24 (×5): 3 [IU] via SUBCUTANEOUS
  Administered 2016-05-24: 5 [IU] via SUBCUTANEOUS
  Administered 2016-05-25: 3 [IU] via SUBCUTANEOUS
  Administered 2016-05-25 (×2): 5 [IU] via SUBCUTANEOUS
  Administered 2016-05-26: 3 [IU] via SUBCUTANEOUS

## 2016-05-22 MED ORDER — ENOXAPARIN SODIUM 40 MG/0.4ML ~~LOC~~ SOLN
40.0000 mg | SUBCUTANEOUS | Status: DC
  Administered 2016-05-23: 40 mg via SUBCUTANEOUS
  Filled 2016-05-22: qty 0.4

## 2016-05-22 MED ORDER — SERTRALINE HCL 50 MG PO TABS
75.0000 mg | ORAL_TABLET | Freq: Every day | ORAL | Status: DC
  Administered 2016-05-23: 75 mg via ORAL
  Filled 2016-05-22: qty 2

## 2016-05-22 MED ORDER — METHADONE HCL 5 MG PO TABS: 5.0000 mg | ORAL_TABLET | Freq: Two times a day (BID) | ORAL | Status: DC

## 2016-05-22 MED ORDER — SODIUM CHLORIDE 0.9 % IV SOLN
INTRAVENOUS | Status: DC
  Administered 2016-05-23 (×2): via INTRAVENOUS

## 2016-05-22 MED ORDER — HYDRALAZINE HCL 25 MG PO TABS
25.0000 mg | ORAL_TABLET | Freq: Two times a day (BID) | ORAL | Status: DC
  Administered 2016-05-23 – 2016-05-24 (×4): 25 mg via ORAL
  Filled 2016-05-22 (×4): qty 1

## 2016-05-22 MED ORDER — MORPHINE SULFATE (PF) 2 MG/ML IV SOLN
2.0000 mg | Freq: Once | INTRAVENOUS | Status: AC
  Administered 2016-05-22: 2 mg via INTRAVENOUS
  Filled 2016-05-22: qty 1

## 2016-05-22 MED ORDER — BUDESONIDE 0.5 MG/2ML IN SUSP
0.5000 mg | Freq: Two times a day (BID) | RESPIRATORY_TRACT | Status: DC
  Administered 2016-05-23 – 2016-05-26 (×6): 0.5 mg via RESPIRATORY_TRACT
  Filled 2016-05-22 (×7): qty 2

## 2016-05-22 MED ORDER — OXYCODONE-ACETAMINOPHEN 5-325 MG PO TABS
1.0000 | ORAL_TABLET | Freq: Four times a day (QID) | ORAL | Status: DC | PRN
  Administered 2016-05-23 (×2): 1 via ORAL
  Filled 2016-05-22 (×2): qty 1

## 2016-05-22 MED ORDER — GABAPENTIN 100 MG PO CAPS
100.0000 mg | ORAL_CAPSULE | Freq: Every day | ORAL | Status: DC
  Administered 2016-05-23 (×2): 100 mg via ORAL
  Filled 2016-05-22 (×2): qty 1

## 2016-05-22 NOTE — ED Notes (Signed)
Patient and patient's daughter having it out - daughter trying to explain why he is being admitted and patient is giving her push back. They are hugging it out now. Patient now states he is willing to go upstairs and be admitted.

## 2016-05-22 NOTE — H&P (Signed)
History and Physical    Cameron Acosta [id number] DOB: Feb 19, 1936 DOA: 05/22/2016   PCP: Liliane Shi, MD Chief Complaint:  Chief Complaint  Patient presents with  . Fall    HPI: Cameron Acosta is a 80 y.o. male with medical history significant of NASH, AICD, h/o prostate cancer now with PSA of 16, bladder CA resected in March of this year, NIDDM.  Patient was recently admitted for back pain to [facility] and discharged on 7/24, this was believed to be due to malignancy but unable to get MRI due to AICD presence.  They did find lytic lesion at T4 and a lung lesion causing post-obstructive PNA, these likely metastatic CA from bladder.  ED Course: WU including ammonia, CBC, CMP, unremarkable thus far other than an incomplete fracture of greater trochanter of R femur.  He continues to be delirious in ED.  Review of Systems: As per HPI otherwise 10 point review of systems negative.    Past Medical History:  Diagnosis Date  . AICD (automatic cardioverter/defibrillator) present 06/26/2014  . Atrial fibrillation ([place])   . Cancer ([place])   . Cardiac pacemaker in situ 06/26/2014  . CHF (congestive heart failure) ([place])    EF 40-45% on echo 06/26/2014  . CKD (chronic kidney disease) stage 3, GFR 30-59 ml/min 06/26/2014  . History of prostate cancer 06/26/2014  . Hypertension   . Mitral regurgitation    Mild on echo 06/26/2014  . NASH (nonalcoholic steatohepatitis) 06/26/2014  . OSA (obstructive sleep apnea) 06/26/2014   on cpap  . Pacemaker   . Thrombocytopenia ([place]) 06/26/2014    Past Surgical History:  Procedure Laterality Date  . ablasion    . APPENDECTOMY    . CHOLECYSTECTOMY    . COLON SURGERY     for polyp per pt (Dr. Morton Stall at [facility])  . COLONOSCOPY  2015   polyps (repeat per pt in 2018) Dr. Darden Amber at [facility] ARTHROSCOPY Left   . SHOULDER SURGERY Right   . UVULECTOMY       reports that he quit smoking about 33 years ago. His smoking use included  Cigarettes. He has a 1.75 pack-year smoking history. He has never used smokeless tobacco. He reports that he drinks about 3.0 oz of alcohol per week . He reports that he does not use drugs.  No Known Allergies  Family History  Problem Relation Age of Onset  . Dementia Mother   . Congestive Heart Failure Father       Prior to Admission medications   Medication Sig Start Date End Date Taking? Authorizing Provider  carvedilol (COREG) 3.125 MG tablet Take 3.125 mg by mouth 2 (two) times daily with a meal.    Historical Provider, MD  cefpodoxime (VANTIN) 100 MG tablet Take 2 tablets (200 mg total) by mouth 2 (two) times daily. For 5 more days 06/27/14   Bonnielee Haff, MD  glipiZIDE (GLUCOTROL XL) 2.5 MG 24 hr tablet Take 2.5 mg by mouth daily.      Historical Provider, MD  hydrALAZINE (APRESOLINE) 25 MG tablet Take 25 mg by mouth 2 (two) times daily.    Historical Provider, MD  losartan (COZAAR) 50 MG tablet Take 50 mg by mouth every evening.      Historical Provider, MD  omega-3 acid ethyl esters (LOVAZA) 1 G capsule Take 2 g by mouth 2 (two) times daily.      Historical Provider, MD  oxyCODONE (OXY IR/ROXICODONE) 5 MG immediate release tablet Take 1 tablet (5  mg total) by mouth every 4 (four) hours as needed for severe pain. 06/27/14   Bonnielee Haff, MD  rosuvastatin (CRESTOR) 10 MG tablet Take 10 mg by mouth every evening.     Historical Provider, MD  sertraline (ZOLOFT) 50 MG tablet Take 50 mg by mouth at bedtime.    Historical Provider, MD  torsemide (DEMADEX) 20 MG tablet Take 40mg  once daily for now till seen in follow up by your cardiologist or PCP. 06/27/14   Bonnielee Haff, MD  zolpidem (AMBIEN) 10 MG tablet Take 10 mg by mouth at bedtime.      Historical Provider, MD    Physical Exam: Vitals:   05/22/16 1717 05/22/16 1930 05/22/16 2012 05/22/16 2308  BP:  111/60  120/62  Pulse:   64 70  Resp:  17 22 18   Temp:      TempSrc:      SpO2: 98%  94% 96%  Weight:      Height:           Constitutional: NAD, calm, comfortable Eyes: PERRL, lids and conjunctivae normal ENMT: Mucous membranes are moist. Posterior pharynx clear of any exudate or lesions.Normal dentition.  Neck: normal, supple, no masses, no thyromegaly Respiratory: clear to auscultation bilaterally, no wheezing, no crackles. Normal respiratory effort. No accessory muscle use.  Cardiovascular: Regular rate and rhythm, no murmurs / rubs / gallops. No extremity edema. 2+ pedal pulses. No carotid bruits.  Abdomen: no tenderness, no masses palpated. No hepatosplenomegaly. Bowel sounds positive.  Musculoskeletal: no clubbing / cyanosis. No joint deformity upper and lower extremities. Good ROM, no contractures. Normal muscle tone.  Skin: no rashes, lesions, ulcers. No induration Neurologic: CN 2-12 grossly intact. Sensation intact, DTR normal. Strength 5/5 in all 4.  Psychiatric: Knows month and year, is 2 days off on date, dosent know day of week, dosent know name of president but knows its "the make [place] great again guy".   Labs on Admission: I have personally reviewed following labs and imaging studies  CBC:  Recent Labs Lab 05/22/16 1902  WBC 7.1  NEUTROABS 5.1  HGB 12.0*  HCT 34.3*  MCV 95.8  PLT 123XX123*   Basic Metabolic Panel:  Recent Labs Lab 05/22/16 1902  NA 137  K 3.5  CL 102  CO2 26  GLUCOSE 243*  BUN 50*  CREATININE 1.95*  CALCIUM 9.2   GFR: Estimated Creatinine Clearance: 38.6 mL/min (by C-G formula based on SCr of 1.95 mg/dL). Liver Function Tests:  Recent Labs Lab 05/22/16 1902  AST 27  ALT 21  ALKPHOS 102  BILITOT 1.6*  PROT 7.3  ALBUMIN 3.4*   No results for input(s): LIPASE, AMYLASE in the last 168 hours.  Recent Labs Lab 05/22/16 1902  AMMONIA 23   Coagulation Profile:  Recent Labs Lab 05/22/16 1902  INR 1.29   Cardiac Enzymes:  Recent Labs Lab 05/22/16 1902  TROPONINI 0.03*   BNP (last 3 results) No results for input(s): PROBNP in the  last 8760 hours. HbA1C: No results for input(s): HGBA1C in the last 72 hours. CBG: No results for input(s): GLUCAP in the last 168 hours. Lipid Profile: No results for input(s): CHOL, HDL, LDLCALC, TRIG, CHOLHDL, LDLDIRECT in the last 72 hours. Thyroid Function Tests: No results for input(s): TSH, T4TOTAL, FREET4, T3FREE, THYROIDAB in the last 72 hours. Anemia Panel: No results for input(s): VITAMINB12, FOLATE, FERRITIN, TIBC, IRON, RETICCTPCT in the last 72 hours. Urine analysis:    Component Value Date/Time   COLORURINE YELLOW  06/26/2014 1655   APPEARANCEUR TURBID (A) 06/26/2014 1655   LABSPEC 1.019 06/26/2014 1655   PHURINE 5.5 06/26/2014 1655   GLUCOSEU NEGATIVE 06/26/2014 1655   HGBUR LARGE (A) 06/26/2014 1655   BILIRUBINUR NEGATIVE 06/26/2014 1655   KETONESUR NEGATIVE 06/26/2014 1655   PROTEINUR 100 (A) 06/26/2014 1655   UROBILINOGEN 0.2 06/26/2014 1655   NITRITE NEGATIVE 06/26/2014 1655   LEUKOCYTESUR LARGE (A) 06/26/2014 1655   Sepsis Labs: @LABRCNTIP (procalcitonin:4,lacticidven:4) )No results found for this or any previous visit (from the past 240 hour(s)).   Radiological Exams on Admission: Dg Chest 1 View  Result Date: 05/22/2016 CLINICAL DATA:  Cough, pneumonia. EXAM: CHEST 1 VIEW COMPARISON:  Radiograph of June 26, 2014. FINDINGS: Stable cardiomegaly. Right-sided pacemaker is unchanged in position. No pneumothorax or pleural effusion is noted. No acute pulmonary disease is noted. Bony thorax is unremarkable. IMPRESSION: No acute cardiopulmonary abnormality seen. Electronically Signed   By: Marijo Conception, M.D.   On: 05/22/2016 19:30   Ct Head Wo Contrast  Result Date: 05/22/2016 CLINICAL DATA:  Status post fall today.  History of prostate cancer. EXAM: CT HEAD WITHOUT CONTRAST TECHNIQUE: Contiguous axial images were obtained from the base of the skull through the vertex without intravenous contrast. COMPARISON:  Head CT scan 08/22/2014. FINDINGS: There is some  cortical atrophy and chronic microvascular ischemic change. No evidence of acute intracranial abnormality including hemorrhage, infarct, mass lesion, mass effect, midline shift or abnormal extra-axial fluid collection is seen. No hydrocephalus or pneumocephalus. The calvarium is intact. Imaged paranasal sinuses and mastoid air cells are clear. IMPRESSION: No acute abnormality. Atrophy and chronic microvascular ischemic change. Electronically Signed   By: Inge Rise M.D.   On: 05/22/2016 19:58   Dg Hip Unilat With Pelvis 2-3 Views Right  Result Date: 05/22/2016 CLINICAL DATA:  Status post fall today with onset of right hip pain. Initial encounter. EXAM: DG HIP (WITH OR WITHOUT PELVIS) 2-3V RIGHT COMPARISON:  CT pelvis 08/22/2014. FINDINGS: Linear lucency is seen in the greater trochanter extending toward the lesser trochanter and new since the prior examination consistent with acute fracture. The fracture appears incomplete. The patient has remote healed intertrochanteric fracture. Fixation hardware is in place without complicating feature. IMPRESSION: Acute, incomplete fracture of the right greater trochanter. Remote healed intertrochanteric fracture with fixation hardware in place noted. Electronically Signed   By: Inge Rise M.D.   On: 05/22/2016 19:33    EKG: Independently reviewed.  Assessment/Plan Principal Problem:   Delirium Active Problems:   CKD (chronic kidney disease) stage 3, GFR 30-59 ml/min   NASH (nonalcoholic steatohepatitis)   DM type 2 causing renal disease (HCC)   Closed nondisplaced fracture of greater trochanter of right femur (HCC)   Delirium -  UA still pending at time of admission  If negative then I would consider repeat CT chest to take a look at the known chest lesion.  He had post-obstructive PNA due to enterobacter cloacae (sensitive to cefepime, was resistant to ampicillin) during his July [facility] stay.  Closed nondisplaced (incomplete) fracture of  greater trochanter of R femur -  Bed rest for the moment  Call ortho in AM for WB orders  Will go ahead and put him on DVT ppx and let him eat as I doubt this will be a surgical fracture at this point.  CKD stage 3 - creat slightly up from baseline, 1.9 (1.5 last week)  Holding diuretics  Gentle hydration  NIDDM - moderate scale SSI AC/HS  NASH -  Low sodium diet, ammonia not elevated   DVT prophylaxis: Heparin [place] Code Status: Full Family Communication: Daughter at bedside Consults called: None Admission status: Admit to obs   [place], [place] [facility] Pager [phone] from 7PM-7AM  If 7AM-7PM, please contact the day physician for the patient [email or web address] Password [email or web address]  05/22/2016, 11:11 PM

## 2016-05-22 NOTE — ED Notes (Signed)
MD at bedside. 

## 2016-05-22 NOTE — ED Notes (Signed)
Patient transported to CT 

## 2016-05-22 NOTE — ED Provider Notes (Signed)
[facility] DEPT Provider Note   CSN: [id number] Arrival date & time: 05/22/16  1709  First Provider Contact:  First MD Initiated Contact with Patient 05/22/16 1827        History   Chief Complaint Chief Complaint  Patient presents with  . Fall    HPI Cameron Acosta is a 80 y.o. male.  HPI Patient states that he was sitting in a chair and he was reaching for a magazine or something and it caused him to fall forward onto the floor. He denies he hit his head or has a headache. He reports initially he had pain in his right hip but that has now resolved. He reports he can move his hip without difficulty. Now he reports no problems or injury. At the nursing home for rehabilitation for approximately one week. Admitted at [facility] and was treated for pneumonia. Patient's wife reports he seemed confused ever since he has been at the nursing home. The patient himself reports that he is not confused. She requested an ammonia level be checked on him.  Patient's wife and daughter reports the patient was diagnosed with prostate cancer and has a metastatic lesion lesions to his thoracic spine. He was hospitalized for pneumonia for approximately 3 weeks. His daughter who is a nurse reports that towards in the last week he started developing confusion and atypical behaviors. This has increased dramatically in the past several days at the rehabilitation facility. She reports yesterday he had called 60 accusing someone of trying to kidnap him. She reports he is accused people are stealing things and become very verbally abusive. She reports this is not at all his normal demeanor mental status. Past Medical History:  Diagnosis Date  . AICD (automatic cardioverter/defibrillator) present 06/26/2014  . Atrial fibrillation ([place])   . Cancer ([place])   . Cardiac pacemaker in situ 06/26/2014  . CHF (congestive heart failure) ([place])    EF 40-45% on echo 06/26/2014  . CKD (chronic kidney disease) stage 3,  GFR 30-59 ml/min 06/26/2014  . History of prostate cancer 06/26/2014  . Hypertension   . Mitral regurgitation    Mild on echo 06/26/2014  . NASH (nonalcoholic steatohepatitis) 06/26/2014  . OSA (obstructive sleep apnea) 06/26/2014   on cpap  . Pacemaker   . Thrombocytopenia ([place]) 06/26/2014    Patient Active Problem List   Diagnosis Date Noted  . Pressure ulcer 05/24/2016  . Delirium 05/22/2016  . Closed nondisplaced fracture of greater trochanter of right femur ([place]) 05/22/2016  . Hip fracture ([place]) 08/22/2014  . Coagulopathy ([place]) 08/22/2014  . Intertrochanteric fracture of right femur ([place]) 08/22/2014  . Left hemiparesis ([place]) 06/26/2014  . Cardiac pacemaker in situ 06/26/2014  . AICD (automatic cardioverter/defibrillator) present 06/26/2014  . History of prostate cancer 06/26/2014  . Thrombocytopenia ([place]) 06/26/2014  . CKD (chronic kidney disease) stage 3, GFR 30-59 ml/min 06/26/2014  . Long term (current) use of anticoagulants 06/26/2014  . OSA (obstructive sleep apnea) 06/26/2014  . NASH (nonalcoholic steatohepatitis) 06/26/2014  . Radiation proctitis 06/26/2014  . DM type 2 causing renal disease ([place]) 06/26/2014  . Acute renal failure ([place]) 06/25/2014  . Dehydration 06/25/2014  . Atrial fibrillation ([place]) 06/25/2014    Past Surgical History:  Procedure Laterality Date  . ablasion    . APPENDECTOMY    . CHOLECYSTECTOMY    . COLON SURGERY     for polyp per pt (Dr. Morton Stall at [facility])  . COLONOSCOPY  2015   polyps (repeat per pt in  2018) Dr. Darden Amber at [facility] ARTHROSCOPY Left   . SHOULDER SURGERY Right   . UVULECTOMY         Home Medications    Prior to Admission medications   Medication Sig Start Date End Date Taking? Authorizing Provider  albuterol (PROVENTIL HFA;VENTOLIN HFA) 108 (90 Base) MCG/ACT inhaler Inhale 2 puffs into the lungs every 6 (six) hours as needed for wheezing or shortness of breath.   Yes Historical Provider, MD  arformoterol  (BROVANA) 15 MCG/2ML NEBU Take 15 mcg by nebulization 2 (two) times daily.   Yes Historical Provider, MD  aspirin 81 MG chewable tablet Chew 81 mg by mouth daily.   Yes Historical Provider, MD  budesonide (PULMICORT) 0.5 MG/2ML nebulizer solution Take 0.5 mg by nebulization 2 (two) times daily.   Yes Historical Provider, MD  dextromethorphan (DELSYM) 30 MG/5ML liquid Take 60 mg by mouth 2 (two) times daily.   Yes Historical Provider, MD  folic acid (FOLVITE) 1 MG tablet Take 1 mg by mouth daily.   Yes Historical Provider, MD  gabapentin (NEURONTIN) 100 MG capsule Take 100 mg by mouth daily.   Yes Historical Provider, MD  glipiZIDE (GLUCOTROL) 5 MG tablet Take 5-10 mg by mouth 2 (two) times daily before a meal. Take 2 tablets every morning and take 1 tablet every evening   Yes Historical Provider, MD  guaiFENesin-codeine 100-10 MG/5ML syrup Take 10 mLs by mouth every 4 (four) hours as needed for cough.   Yes Historical Provider, MD  HYDROcodone-acetaminophen (NORCO) 7.5-325 MG tablet Take 1 tablet by mouth every 4 (four) hours as needed for moderate pain.   Yes Historical Provider, MD  Multiple Vitamin (MULTIVITAMIN WITH MINERALS) TABS tablet Take 1 tablet by mouth daily.   Yes Historical Provider, MD  omeprazole (PRILOSEC) 20 MG capsule Take 20 mg by mouth 2 (two) times daily before a meal.   Yes Historical Provider, MD  rosuvastatin (CRESTOR) 20 MG tablet Take 20 mg by mouth daily.   Yes Historical Provider, MD  thiamine (VITAMIN B-1) 50 MG tablet Take 50 mg by mouth daily.   Yes Historical Provider, MD  torsemide (DEMADEX) 100 MG tablet Take 50 mg by mouth daily.   Yes Historical Provider, MD  warfarin (COUMADIN) 5 MG tablet Take 5 mg by mouth daily.   Yes Historical Provider, MD  zolpidem (AMBIEN) 10 MG tablet Take 10 mg by mouth at bedtime.     Yes Historical Provider, MD  amoxicillin (AMOXIL) 500 MG capsule Take 1 capsule (500 mg total) by mouth 3 (three) times daily. 05/26/16   Hosie Poisson, MD    carvedilol (COREG) 3.125 MG tablet Take 1 tablet (3.125 mg total) by mouth 2 (two) times daily with a meal. 05/26/16   Hosie Poisson, MD  losartan (COZAAR) 25 MG tablet Take 1 tablet (25 mg total) by mouth daily. 05/26/16   Hosie Poisson, MD  QUEtiapine (SEROQUEL) 25 MG tablet Take 1 tablet (25 mg total) by mouth at bedtime. 05/26/16   Hosie Poisson, MD    Family History Family History  Problem Relation Age of Onset  . Dementia Mother   . Congestive Heart Failure Father     Social History Social History  Substance Use Topics  . Smoking status: Former Smoker    Packs/day: 0.25    Years: 7.00    Types: Cigarettes    Quit date: 10/13/1982  . Smokeless tobacco: Never Used  . Alcohol use 3.0 oz/week    5  Standard drinks or equivalent per week     Comment: occasionally     Allergies   Povidone iodine; Zoledronic acid; and Contrast media [iodinated diagnostic agents]   Review of Systems Review of Systems 10 Systems reviewed and are negative for acute change except as noted in the HPI.   Physical Exam Updated Vital Signs BP (!) 110/59 (BP Location: Right Arm)   Pulse 70   Temp 97.4 F (36.3 C) (Oral)   Resp 20   Ht 5\' 7"  (1.702 m)   Wt 256 lb (116.1 kg)   SpO2 94%   BMI 40.10 kg/m   Physical Exam  Constitutional:  Patient is obese and deconditioned. He is alert. He is not somnolent. He does seem slightly hypervigilant.  HENT:  Head: Normocephalic and atraumatic.  Nose: Nose normal.  Meters membranes are moderately dry.  Eyes: EOM are normal. Pupils are equal, round, and reactive to light. No scleral icterus.  Neck: Neck supple.  Cardiovascular: Normal rate, regular rhythm, normal heart sounds and intact distal pulses.   Pulmonary/Chest:  No respiratory distress. Occasional wet cough. Scattered rhonchi. Adequate air flow. No gross wheeze.  Abdominal: Soft. Bowel sounds are normal. He exhibits no distension. There is no tenderness.  Musculoskeletal: Normal range of  motion.  Patient is deconditioned. He has multiple old ecchymoses over the upper extremities and some over the lower extremities as well. Only trace peripheral edema. Patient is spontaneously elevating and flexing at the hip. He can push me away with each leg independently.  Neurological: He is alert. No cranial nerve deficit. He exhibits normal muscle tone.  Skin: Skin is warm and dry.  Psychiatric:  Mildly hypervigilant. Although speech is situationally appropriate. There is a slight tone of paranoia and possibly confabulation.     ED Treatments / Results  Labs (all labs ordered are listed, but only abnormal results are displayed) Labs Reviewed  URINE CULTURE - Abnormal; Notable for the following:       Result Value   Culture   (*)    Value: >=100,000 COLONIES/mL ENTEROCOCCUS SPECIES >=100,000 COLONIES/mL YEAST    Organism ID, Bacteria ENTEROCOCCUS SPECIES (*)    All other components within normal limits  COMPREHENSIVE METABOLIC PANEL - Abnormal; Notable for the following:    Glucose, Bld 243 (*)    BUN 50 (*)    Creatinine, Ser 1.95 (*)    Albumin 3.4 (*)    Total Bilirubin 1.6 (*)    GFR calc non Af Amer 31 (*)    GFR calc Af Amer 36 (*)    All other components within normal limits  TROPONIN I - Abnormal; Notable for the following:    Troponin I 0.03 (*)    All other components within normal limits  CBC WITH DIFFERENTIAL/PLATELET - Abnormal; Notable for the following:    RBC 3.58 (*)    Hemoglobin 12.0 (*)    HCT 34.3 (*)    Platelets 122 (*)    All other components within normal limits  PROTIME-INR - Abnormal; Notable for the following:    Prothrombin Time 16.2 (*)    All other components within normal limits  URINALYSIS, ROUTINE W REFLEX MICROSCOPIC (NOT AT [facility]) - Abnormal; Notable for the following:    APPearance CLOUDY (*)    Hgb urine dipstick MODERATE (*)    Protein, ur 30 (*)    Leukocytes, UA LARGE (*)    All other components within normal limits  BASIC  METABOLIC PANEL - Abnormal;  Notable for the following:    Potassium 3.0 (*)    Glucose, Bld 173 (*)    BUN 40 (*)    Creatinine, Ser 1.42 (*)    GFR calc non Af Amer 46 (*)    GFR calc Af Amer 53 (*)    All other components within normal limits  URINE MICROSCOPIC-ADD ON - Abnormal; Notable for the following:    Squamous Epithelial / LPF 0-5 (*)    Bacteria, UA RARE (*)    All other components within normal limits  GLUCOSE, CAPILLARY - Abnormal; Notable for the following:    Glucose-Capillary 189 (*)    All other components within normal limits  GLUCOSE, CAPILLARY - Abnormal; Notable for the following:    Glucose-Capillary 196 (*)    All other components within normal limits  HEMOGLOBIN A1C - Abnormal; Notable for the following:    Hgb A1c MFr Bld 6.8 (*)    All other components within normal limits  BASIC METABOLIC PANEL - Abnormal; Notable for the following:    Potassium 3.3 (*)    Glucose, Bld 169 (*)    BUN 23 (*)    All other components within normal limits  CBC - Abnormal; Notable for the following:    WBC 3.3 (*)    RBC 3.76 (*)    Hemoglobin 11.8 (*)    HCT 36.6 (*)    Platelets 95 (*)    All other components within normal limits  GLUCOSE, CAPILLARY - Abnormal; Notable for the following:    Glucose-Capillary 188 (*)    All other components within normal limits  GLUCOSE, CAPILLARY - Abnormal; Notable for the following:    Glucose-Capillary 145 (*)    All other components within normal limits  GLUCOSE, CAPILLARY - Abnormal; Notable for the following:    Glucose-Capillary 163 (*)    All other components within normal limits  GLUCOSE, CAPILLARY - Abnormal; Notable for the following:    Glucose-Capillary 155 (*)    All other components within normal limits  PROTIME-INR - Abnormal; Notable for the following:    Prothrombin Time 19.4 (*)    All other components within normal limits  CBC - Abnormal; Notable for the following:    RBC 3.25 (*)    Hemoglobin 10.4 (*)     HCT 31.8 (*)    Platelets 109 (*)    All other components within normal limits  GLUCOSE, CAPILLARY - Abnormal; Notable for the following:    Glucose-Capillary 208 (*)    All other components within normal limits  GLUCOSE, CAPILLARY - Abnormal; Notable for the following:    Glucose-Capillary 179 (*)    All other components within normal limits  GLUCOSE, CAPILLARY - Abnormal; Notable for the following:    Glucose-Capillary 168 (*)    All other components within normal limits  GLUCOSE, CAPILLARY - Abnormal; Notable for the following:    Glucose-Capillary 234 (*)    All other components within normal limits  PROTIME-INR - Abnormal; Notable for the following:    Prothrombin Time 23.5 (*)    All other components within normal limits  GLUCOSE, CAPILLARY - Abnormal; Notable for the following:    Glucose-Capillary 206 (*)    All other components within normal limits  GLUCOSE, CAPILLARY - Abnormal; Notable for the following:    Glucose-Capillary 157 (*)    All other components within normal limits  GLUCOSE, CAPILLARY - Abnormal; Notable for the following:    Glucose-Capillary 168 (*)  All other components within normal limits  GLUCOSE, CAPILLARY - Abnormal; Notable for the following:    Glucose-Capillary 162 (*)    All other components within normal limits  MRSA PCR SCREENING  AMMONIA    EKG  EKG Interpretation  Date/Time:  Thursday May 22 2016 17:45:14 EDT Ventricular Rate:  71 PR Interval:    QRS Duration: 170 QT Interval:  498 QTC Calculation: 541 R Axis:   144 Text Interpretation:  Ventricular-paced rhythm Abnormal ECG agree Confirmed by Johnney Killian, MD, Jeannie Done [phone]) on 05/22/2016 6:18:30 PM       Radiology No results found.  Procedures Procedures (including critical care time)  Medications Ordered in ED Medications  morphine 2 MG/ML injection 2 mg (2 mg Intravenous Given 05/22/16 2345)  potassium chloride SA (K-DUR,KLOR-CON) CR tablet 40 mEq (40 mEq Oral Given  05/23/16 1424)  warfarin (COUMADIN) tablet 5 mg (5 mg Oral Given 05/23/16 1740)  lidocaine (XYLOCAINE) 2 % jelly 1 application (1 application Urethral Given 05/23/16 1900)  LORazepam (ATIVAN) injection 1 mg (1 mg Intravenous Given 05/23/16 2206)  warfarin (COUMADIN) tablet 5 mg (5 mg Oral Given 05/24/16 1822)  warfarin (COUMADIN) tablet 5 mg (5 mg Oral Given 05/25/16 1812)     Initial Impression / Assessment and Plan / ED Course  I have reviewed the triage vital signs and the nursing notes.  Pertinent labs & imaging results that were available during my care of the patient were reviewed by me and considered in my medical decision making (see chart for details).  Clinical Course      Final Clinical Impressions(s) / ED Diagnoses   Final diagnoses:  Delirium  Closed fracture of trochanter of right femur, initial encounter [facility])   Patient presents with fall and hip pain. Closed fracture of right trochanter identified. Patient's wife and daughter report increasing confusion and disorientation with significant behavior change. At this time patient has symptoms consistent with delirium of uncertain etiology. Plan will be for hospital admission for further evaluation.  New Prescriptions Discharge Medication List as of 05/26/2016  2:53 PM    START taking these medications   Details  amoxicillin (AMOXIL) 500 MG capsule Take 1 capsule (500 mg total) by mouth 3 (three) times daily., Starting Mon 05/26/2016, Print    QUEtiapine (SEROQUEL) 25 MG tablet Take 1 tablet (25 mg total) by mouth at bedtime., Starting Mon 05/26/2016, Print         Charlesetta Shanks, MD 05/31/16 1158

## 2016-05-22 NOTE — ED Notes (Signed)
Patient moaning out in pain, pt's daughter states he has had costochondritis lately. MD aware, to update patient and his daughter as soon as she can.

## 2016-05-22 NOTE — ED Notes (Signed)
Provided cup of ice water to patient per MD request. Patient being difficult, stating he wants the doctor to give him the water. RN explained that the doctor is busy in another room at this time and she asked this RN to give it to him. Patient wanting to move [facility] up and down on his own, but is unable to reach it (behind [facility]) and when RN explained to him that he cannot reach it he yelled at RN "whatever, that is stupid!"

## 2016-05-22 NOTE — ED Notes (Signed)
Patient transferred from stretcher to wheelchair because he refused to stay in bed and stated he needed to go to the restroom. X-ray came to get patient, and d/t him being a high fall risk, he needs to be in the bed. Patient now refusing to get back in bed. MD aware.

## 2016-05-22 NOTE — ED Triage Notes (Signed)
Pt had witness fall at [facility], c/o of right hip pain. No reported LOC.

## 2016-05-22 NOTE — ED Notes (Signed)
Pt tried to get out of bed. Pt stated that he only wanted to sit at the end of the bed. Pt given wheelchair to sit in, but was considered a FALL RISK and asked to get back into the bed. Pt refused. Pt informed that in order to go to x-ray, pt needed to get back into bed. Pt complied. FALL RISK (yellow) armband and yellow socks were placed on the pt. Pt transported to x-ray.

## 2016-05-22 NOTE — ED Notes (Addendum)
Patient reports fall and R hip pain and leg pain initially upon arrival. He now states that his hip is no longer hurting and he is fine. Wife at the bedside would like to speak with a Education officer, museum regarding getting him some help at home. Per wife, he was at [facility] for a few weeks and transferred to [facility] for a week. He states he does not want to go there and want to go home.

## 2016-05-22 NOTE — ED Notes (Signed)
Patient's daughter now here, states while he was at [facility], he had scans done that show a mass in his lung and a lesion on his spine. His MDs think this is possibly r/t metastasis from prostate cancer.

## 2016-05-22 NOTE — ED Notes (Signed)
Patient unwilling to receive IV at this time, states he does not need the morphine and does not want an IV right now. MD aware.

## 2016-05-23 DIAGNOSIS — E1129 Type 2 diabetes mellitus with other diabetic kidney complication: Secondary | ICD-10-CM | POA: Diagnosis not present

## 2016-05-23 DIAGNOSIS — Z95810 Presence of automatic (implantable) cardiac defibrillator: Secondary | ICD-10-CM | POA: Diagnosis not present

## 2016-05-23 DIAGNOSIS — Z8546 Personal history of malignant neoplasm of prostate: Secondary | ICD-10-CM | POA: Diagnosis not present

## 2016-05-23 DIAGNOSIS — R4189 Other symptoms and signs involving cognitive functions and awareness: Secondary | ICD-10-CM | POA: Diagnosis not present

## 2016-05-23 DIAGNOSIS — E1122 Type 2 diabetes mellitus with diabetic chronic kidney disease: Secondary | ICD-10-CM | POA: Diagnosis present

## 2016-05-23 DIAGNOSIS — I340 Nonrheumatic mitral (valve) insufficiency: Secondary | ICD-10-CM | POA: Diagnosis present

## 2016-05-23 DIAGNOSIS — Z7984 Long term (current) use of oral hypoglycemic drugs: Secondary | ICD-10-CM | POA: Diagnosis not present

## 2016-05-23 DIAGNOSIS — I130 Hypertensive heart and chronic kidney disease with heart failure and stage 1 through stage 4 chronic kidney disease, or unspecified chronic kidney disease: Secondary | ICD-10-CM | POA: Diagnosis present

## 2016-05-23 DIAGNOSIS — Z8551 Personal history of malignant neoplasm of bladder: Secondary | ICD-10-CM | POA: Diagnosis not present

## 2016-05-23 DIAGNOSIS — L89311 Pressure ulcer of right buttock, stage 1: Secondary | ICD-10-CM | POA: Diagnosis present

## 2016-05-23 DIAGNOSIS — S72114A Nondisplaced fracture of greater trochanter of right femur, initial encounter for closed fracture: Secondary | ICD-10-CM | POA: Diagnosis present

## 2016-05-23 DIAGNOSIS — N183 Chronic kidney disease, stage 3 (moderate): Secondary | ICD-10-CM | POA: Diagnosis present

## 2016-05-23 DIAGNOSIS — Z66 Do not resuscitate: Secondary | ICD-10-CM | POA: Diagnosis present

## 2016-05-23 DIAGNOSIS — Z87891 Personal history of nicotine dependence: Secondary | ICD-10-CM | POA: Diagnosis not present

## 2016-05-23 DIAGNOSIS — N390 Urinary tract infection, site not specified: Secondary | ICD-10-CM

## 2016-05-23 DIAGNOSIS — G4733 Obstructive sleep apnea (adult) (pediatric): Secondary | ICD-10-CM | POA: Diagnosis present

## 2016-05-23 DIAGNOSIS — K7581 Nonalcoholic steatohepatitis (NASH): Secondary | ICD-10-CM | POA: Diagnosis present

## 2016-05-23 DIAGNOSIS — I4891 Unspecified atrial fibrillation: Secondary | ICD-10-CM | POA: Diagnosis present

## 2016-05-23 DIAGNOSIS — R410 Disorientation, unspecified: Secondary | ICD-10-CM | POA: Diagnosis present

## 2016-05-23 DIAGNOSIS — W19XXXA Unspecified fall, initial encounter: Secondary | ICD-10-CM | POA: Diagnosis present

## 2016-05-23 LAB — GLUCOSE, CAPILLARY
Glucose-Capillary: 189 mg/dL — ABNORMAL HIGH (ref 65–99)
Glucose-Capillary: 196 mg/dL — ABNORMAL HIGH (ref 65–99)
Glucose-Capillary: 188 mg/dL — ABNORMAL HIGH (ref 65–99)
Glucose-Capillary: 145 mg/dL — ABNORMAL HIGH (ref 65–99)

## 2016-05-23 LAB — BASIC METABOLIC PANEL
Anion gap: 11 (ref 5–15)
BUN: 40 mg/dL — AB (ref 6–20)
CALCIUM: 8.9 mg/dL (ref 8.9–10.3)
CO2: 25 mmol/L (ref 22–32)
CREATININE: 1.42 mg/dL — AB (ref 0.61–1.24)
Chloride: 105 mmol/L (ref 101–111)
GFR calc Af Amer: 53 mL/min — ABNORMAL LOW (ref >60)
GFR calc non Af Amer: 46 mL/min — ABNORMAL LOW (ref >60)
GLUCOSE: 173 mg/dL — AB (ref 65–99)
Potassium: 3 mmol/L — ABNORMAL LOW (ref 3.5–5.1)
Sodium: 141 mmol/L (ref 135–145)

## 2016-05-23 LAB — MRSA PCR SCREENING: MRSA by PCR: NEGATIVE

## 2016-05-23 MED ORDER — OXYCODONE-ACETAMINOPHEN 5-325 MG PO TABS
1.0000 | ORAL_TABLET | Freq: Four times a day (QID) | ORAL | Status: DC | PRN
  Administered 2016-05-23 – 2016-05-26 (×2): 1 via ORAL
  Filled 2016-05-23 (×2): qty 1

## 2016-05-23 MED ORDER — WARFARIN SODIUM 5 MG PO TABS
5.0000 mg | ORAL_TABLET | Freq: Once | ORAL | Status: AC
  Administered 2016-05-23: 5 mg via ORAL
  Filled 2016-05-23: qty 1

## 2016-05-23 MED ORDER — DEXTROSE 5 % IV SOLN
1.0000 g | INTRAVENOUS | Status: DC
  Administered 2016-05-23 – 2016-05-25 (×3): 1 g via INTRAVENOUS
  Filled 2016-05-23 (×5): qty 10

## 2016-05-23 MED ORDER — LORAZEPAM 2 MG/ML IJ SOLN
1.0000 mg | Freq: Once | INTRAMUSCULAR | Status: AC
  Administered 2016-05-23: 1 mg via INTRAVENOUS
  Filled 2016-05-23: qty 1

## 2016-05-23 MED ORDER — LIDOCAINE HCL 2 % EX GEL
1.0000 "application " | Freq: Once | CUTANEOUS | Status: AC
  Administered 2016-05-23: 1 via URETHRAL
  Filled 2016-05-23: qty 5

## 2016-05-23 MED ORDER — POTASSIUM CHLORIDE CRYS ER 20 MEQ PO TBCR
40.0000 meq | EXTENDED_RELEASE_TABLET | Freq: Once | ORAL | Status: AC
  Administered 2016-05-23: 40 meq via ORAL
  Filled 2016-05-23: qty 2

## 2016-05-23 MED ORDER — WARFARIN - PHARMACIST DOSING INPATIENT: Freq: Every day | Status: DC

## 2016-05-23 NOTE — Progress Notes (Signed)
[facility] PROGRESS NOTE  Cameron Acosta [id number] DOB: 12/21/1935 DOA: 05/22/2016 PCP: Liliane Shi, MD  Assessment/Plan:  80 y.o. male with PMH of CAD, A fib (on coumadin), CHF, Bi-V ICD, CKD, Cirrhosis 2/2 NASH,  OSA on CPAP, hx of prostate cancer s/p XRT and hx of bladder cancer s/p TUR bladder tumor (on 01/01/16), bronchial lesion/lytic t4 lesion, chronic foley cath, recently admitted to [facility] for pneumonia, delirium and GI bleed discharged to rehab presented after a fall. Found to have incomplete right hip fracture  Fall. Acute, incomplete fracture of the right greater trochanter. Patient is not well ambulatory at baseline.  -awaiting orthopedics evaluation. PT/OT.   Delirium. D/w patient, his wife, daughter. They report worsening delirium after starting methadone, ativan for 1 week -neuro exam is non focal. CT head: no acute infarcts, but atrophy. Will hold opioids, benzo. Treat UTI  Left lower lobe central obstructing lesion with ill defined T4 lytic lesion. Patient, family has follow up apointment at [facility] in august  UTI. Started on iv ceftriaxone 8/11>>. Will exchange foley cath  CAD, A fib (on coumadin), CHF, Bi-V ICD. No acute chest pains. Will d/c IVF. D/w patient, his family at length. They have requested to restart coumadin, patient was already on coumadin at rehab. No s/s of bleeding      Code Status: DNR Family Communication: d/w patient, his wife, daughter  (indicate person spoken with, relationship, and if by phone, the number) Disposition Plan: pend PT    Consultants:  Ortho   Procedures:  none  Antibiotics:  Ceftriaxone 8/11>>> (indicate start date, and stop date if known)  HPI/Subjective: Alert, confused to time   Objective: Vitals:   05/23/16 0218 05/23/16 0532  BP: 125/66 119/64  Pulse: 70 73  Resp:  18  Temp:  98.3 F (36.8 C)    Intake/Output Summary (Last 24 hours) at 05/23/16 1252 Last data filed at  05/23/16 1200  Gross per 24 hour  Intake                0 ml  Output              700 ml  Net             -700 ml   Filed Weights   05/22/16 1714  Weight: 116.1 kg (256 lb)    Exam:   General:  Comfortable   Cardiovascular: s1,s2 rrr  Respiratory: CTA BL   Abdomen: soft, tn, nd   Musculoskeletal: no leg edema    Data Reviewed: Basic Metabolic Panel:  Recent Labs Lab 05/22/16 1902 05/23/16 0423  NA 137 141  K 3.5 3.0*  CL 102 105  CO2 26 25  GLUCOSE 243* 173*  BUN 50* 40*  CREATININE 1.95* 1.42*  CALCIUM 9.2 8.9   Liver Function Tests:  Recent Labs Lab 05/22/16 1902  AST 27  ALT 21  ALKPHOS 102  BILITOT 1.6*  PROT 7.3  ALBUMIN 3.4*   No results for input(s): LIPASE, AMYLASE in the last 168 hours.  Recent Labs Lab 05/22/16 1902  AMMONIA 23   CBC:  Recent Labs Lab 05/22/16 1902  WBC 7.1  NEUTROABS 5.1  HGB 12.0*  HCT 34.3*  MCV 95.8  PLT 122*   Cardiac Enzymes:  Recent Labs Lab 05/22/16 1902  TROPONINI 0.03*   BNP (last 3 results) No results for input(s): BNP in the last 8760 hours.  ProBNP (last 3 results) No results for input(s): PROBNP in the last 8760 hours.  CBG:  Recent Labs Lab 05/23/16 0809 05/23/16 1237  GLUCAP 189* 196*    No results found for this or any previous visit (from the past 240 hour(s)).   Studies: Dg Chest 1 View  Result Date: 05/22/2016 CLINICAL DATA:  Cough, pneumonia. EXAM: CHEST 1 VIEW COMPARISON:  Radiograph of June 26, 2014. FINDINGS: Stable cardiomegaly. Right-sided pacemaker is unchanged in position. No pneumothorax or pleural effusion is noted. No acute pulmonary disease is noted. Bony thorax is unremarkable. IMPRESSION: No acute cardiopulmonary abnormality seen. Electronically Signed   By: Marijo Conception, M.D.   On: 05/22/2016 19:30   Ct Head Wo Contrast  Result Date: 05/22/2016 CLINICAL DATA:  Status post fall today.  History of prostate cancer. EXAM: CT HEAD WITHOUT CONTRAST  TECHNIQUE: Contiguous axial images were obtained from the base of the skull through the vertex without intravenous contrast. COMPARISON:  Head CT scan 08/22/2014. FINDINGS: There is some cortical atrophy and chronic microvascular ischemic change. No evidence of acute intracranial abnormality including hemorrhage, infarct, mass lesion, mass effect, midline shift or abnormal extra-axial fluid collection is seen. No hydrocephalus or pneumocephalus. The calvarium is intact. Imaged paranasal sinuses and mastoid air cells are clear. IMPRESSION: No acute abnormality. Atrophy and chronic microvascular ischemic change. Electronically Signed   By: Inge Rise M.D.   On: 05/22/2016 19:58   Dg Hip Unilat With Pelvis 2-3 Views Right  Result Date: 05/22/2016 CLINICAL DATA:  Status post fall today with onset of right hip pain. Initial encounter. EXAM: DG HIP (WITH OR WITHOUT PELVIS) 2-3V RIGHT COMPARISON:  CT pelvis 08/22/2014. FINDINGS: Linear lucency is seen in the greater trochanter extending toward the lesser trochanter and new since the prior examination consistent with acute fracture. The fracture appears incomplete. The patient has remote healed intertrochanteric fracture. Fixation hardware is in place without complicating feature. IMPRESSION: Acute, incomplete fracture of the right greater trochanter. Remote healed intertrochanteric fracture with fixation hardware in place noted. Electronically Signed   By: Inge Rise M.D.   On: 05/22/2016 19:33    Scheduled Meds: . budesonide (PULMICORT) nebulizer solution  0.5 mg Nebulization BID  . enoxaparin (LOVENOX) injection  40 mg Subcutaneous Q24H  . gabapentin  100 mg Oral QHS  . hydrALAZINE  25 mg Oral BID  . insulin aspart  0-15 Units Subcutaneous TID WC  . methadone  5 mg Oral Q12H  . pantoprazole  40 mg Oral Daily  . sertraline  75 mg Oral Daily   Continuous Infusions: . sodium chloride 125 mL/hr at 05/23/16 [id number]    Principal Problem:    Delirium Active Problems:   CKD (chronic kidney disease) stage 3, GFR 30-59 ml/min   NASH (nonalcoholic steatohepatitis)   DM type 2 causing renal disease ([place])   Closed nondisplaced fracture of greater trochanter of right femur ([place])    Time spent: >35 minutes     Kinnie Feil  [facility] Pager [phone]. If 7PM-7AM, please contact night-coverage at [email or web address], password [facility] 05/23/2016, 12:52 PM  LOS: 0 days

## 2016-05-23 NOTE — Progress Notes (Signed)
ANTICOAGULATION CONSULT NOTE - Initial Consult  Pharmacy Consult for Coumadin Indication: atrial fibrillation  Allergies  Allergen Reactions  . Povidone Iodine Itching  . Zoledronic Acid Other (See Comments)    Flu-like symptoms with-in 4 hours of receiving medidcation  . Contrast Media [Iodinated Diagnostic Agents] Other (See Comments)    Unknown     Patient Measurements: Height: 5\' 7"  (170.2 cm) Weight: 256 lb (116.1 kg) IBW/kg (Calculated) : 66.1  Vital Signs: Temp: 98.3 F (36.8 C) (08/11 0532) Temp Source: Oral (08/11 0532) BP: 119/64 (08/11 0532) Pulse Rate: 73 (08/11 0532)  Labs:  Recent Labs  05/22/16 1902 05/23/16 0423  HGB 12.0*  --   HCT 34.3*  --   PLT 122*  --   LABPROT 16.2*  --   INR 1.29  --   CREATININE 1.95* 1.42*  TROPONINI 0.03*  --     Estimated Creatinine Clearance: 53.1 mL/min (by C-G formula based on SCr of 1.42 mg/dL).   Medical History: Past Medical History:  Diagnosis Date  . AICD (automatic cardioverter/defibrillator) present 06/26/2014  . Atrial fibrillation ([place])   . Cancer ([place])   . Cardiac pacemaker in situ 06/26/2014  . CHF (congestive heart failure) ([place])    EF 40-45% on echo 06/26/2014  . CKD (chronic kidney disease) stage 3, GFR 30-59 ml/min 06/26/2014  . History of prostate cancer 06/26/2014  . Hypertension   . Mitral regurgitation    Mild on echo 06/26/2014  . NASH (nonalcoholic steatohepatitis) 06/26/2014  . OSA (obstructive sleep apnea) 06/26/2014   on cpap  . Pacemaker   . Thrombocytopenia ([place]) 06/26/2014    Medications:  Scheduled:  . budesonide (PULMICORT) nebulizer solution  0.5 mg Nebulization BID  . cefTRIAXone (ROCEPHIN)  IV  1 g Intravenous Q24H  . gabapentin  100 mg Oral QHS  . hydrALAZINE  25 mg Oral BID  . insulin aspart  0-15 Units Subcutaneous TID WC  . pantoprazole  40 mg Oral Daily  . potassium chloride  40 mEq Oral Once    Assessment: 80 year old male admitted from rehab after a fall and  found to have non-displaced R-hip fracture. No surgical plans per Dr. Daleen Bo and plan to resume Coumadin per pharmacy dosing.   Patient is very confused but spoke with his daughter on the phone who reports that patient has been refusing Coumadin as well as other medications and diet at the rehab facility. Per information from rehab facility - he was to be on 5mg  daily and they recorded the last dose at 8/10. INR today is sub-therapeutic at 1.29. Starting Rocephin for a UTI.   Goal of Therapy:  INR 2-3 Monitor platelets by anticoagulation protocol: Yes   Plan:  Coumadin 5mg  po x1 today.  Daily PT/INR.   Sloan Leiter, PharmD, BCPS Clinical Pharmacist [phone]  05/23/2016,1:56 PM

## 2016-05-23 NOTE — Progress Notes (Signed)
Pt. is very agaitated, got up out of bed and will not go back, security called and is up.  Text paged night coverage to see if we get some medications. Will continue to monitor and keep pt safe.  Alphonzo Lemmings, RN

## 2016-05-23 NOTE — Progress Notes (Signed)
Notified on call provider in regards to patient having chest pain. EKG performed; VSS. Troponin negative in ER.Will continue to monitor patien

## 2016-05-23 NOTE — Consult Note (Signed)
ORTHOPAEDIC CONSULTATION  REQUESTING PHYSICIAN: Kinnie Feil, MD  Chief Complaint: Right hip pain  HPI: Cameron Acosta is a 80 y.o. male who presents with right hip pain. Patient has had double medical problems and reports a fall onto his right hip.  Past Medical History:  Diagnosis Date  . AICD (automatic cardioverter/defibrillator) present 06/26/2014  . Atrial fibrillation ([place])   . Cancer ([place])   . Cardiac pacemaker in situ 06/26/2014  . CHF (congestive heart failure) ([place])    EF 40-45% on echo 06/26/2014  . CKD (chronic kidney disease) stage 3, GFR 30-59 ml/min 06/26/2014  . History of prostate cancer 06/26/2014  . Hypertension   . Mitral regurgitation    Mild on echo 06/26/2014  . NASH (nonalcoholic steatohepatitis) 06/26/2014  . OSA (obstructive sleep apnea) 06/26/2014   on cpap  . Pacemaker   . Thrombocytopenia ([place]) 06/26/2014   Past Surgical History:  Procedure Laterality Date  . ablasion    . APPENDECTOMY    . CHOLECYSTECTOMY    . COLON SURGERY     for polyp per pt (Dr. Morton Stall at [facility])  . COLONOSCOPY  2015   polyps (repeat per pt in 2018) Dr. Darden Amber at [place] ARTHROSCOPY Left   . SHOULDER SURGERY Right   . UVULECTOMY     Social History   Social History  . Marital status: Married    Spouse name: N/A  . Number of children: N/A  . Years of education: N/A   Social History Main Topics  . Smoking status: Former Smoker    Packs/day: 0.25    Years: 7.00    Types: Cigarettes    Quit date: 10/13/1982  . Smokeless tobacco: Never Used  . Alcohol use 3.0 oz/week    5 Standard drinks or equivalent per week     Comment: occasionally  . Drug use: No  . Sexual activity: Not Asked   Other Topics Concern  . None   Social History Narrative  . None   Family History  Problem Relation Age of Onset  . Dementia Mother   . Congestive Heart Failure Father    - negative except otherwise stated in the family history section Allergies  Allergen  Reactions  . Povidone Iodine Itching  . Zoledronic Acid Other (See Comments)    Flu-like symptoms with-in 4 hours of receiving medidcation  . Contrast Media [Iodinated Diagnostic Agents] Other (See Comments)    Unknown    Prior to Admission medications   Medication Sig Start Date End Date Taking? Authorizing Provider  albuterol (PROVENTIL HFA;VENTOLIN HFA) 108 (90 Base) MCG/ACT inhaler Inhale 2 puffs into the lungs every 6 (six) hours as needed for wheezing or shortness of breath.   Yes Historical Provider, MD  arformoterol (BROVANA) 15 MCG/2ML NEBU Take 15 mcg by nebulization 2 (two) times daily.   Yes Historical Provider, MD  aspirin 81 MG chewable tablet Chew 81 mg by mouth daily.   Yes Historical Provider, MD  budesonide (PULMICORT) 0.5 MG/2ML nebulizer solution Take 0.5 mg by nebulization 2 (two) times daily.   Yes Historical Provider, MD  carvedilol (COREG) 6.25 MG tablet Take 6.25 mg by mouth 2 (two) times daily with a meal.   Yes Historical Provider, MD  dextromethorphan (DELSYM) 30 MG/5ML liquid Take 60 mg by mouth 2 (two) times daily.   Yes Historical Provider, MD  folic acid (FOLVITE) 1 MG tablet Take 1 mg by mouth daily.   Yes Historical Provider, MD  gabapentin (NEURONTIN) 100 MG capsule Take 100 mg by mouth daily.   Yes Historical Provider, MD  glipiZIDE (GLUCOTROL) 5 MG tablet Take 5-10 mg by mouth 2 (two) times daily before a meal. Take 2 tablets every morning and take 1 tablet every evening   Yes Historical Provider, MD  guaiFENesin-codeine 100-10 MG/5ML syrup Take 10 mLs by mouth every 4 (four) hours as needed for cough.   Yes Historical Provider, MD  haloperidol (HALDOL) 0.5 MG tablet Take 0.5 mg by mouth 2 (two) times daily.   Yes Historical Provider, MD  hydrALAZINE (APRESOLINE) 25 MG tablet Take 25 mg by mouth 2 (two) times daily.   Yes Historical Provider, MD  HYDROcodone-acetaminophen (NORCO) 7.5-325 MG tablet Take 1 tablet by mouth every 4 (four) hours as needed for  moderate pain.   Yes Historical Provider, MD  LORazepam (ATIVAN) 2 MG/ML injection Inject 0.5 mg into the vein every 6 (six) hours as needed for anxiety.   Yes Historical Provider, MD  losartan (COZAAR) 50 MG tablet Take 50 mg by mouth every evening.     Yes Historical Provider, MD  methadone (DOLOPHINE) 5 MG tablet Take 5 mg by mouth every 12 (twelve) hours.   Yes Historical Provider, MD  Multiple Vitamin (MULTIVITAMIN WITH MINERALS) TABS tablet Take 1 tablet by mouth daily.   Yes Historical Provider, MD  omeprazole (PRILOSEC) 20 MG capsule Take 20 mg by mouth 2 (two) times daily before a meal.   Yes Historical Provider, MD  rosuvastatin (CRESTOR) 20 MG tablet Take 20 mg by mouth daily.   Yes Historical Provider, MD  sertraline (ZOLOFT) 50 MG tablet Take 75 mg by mouth daily.    Yes Historical Provider, MD  thiamine (VITAMIN B-1) 50 MG tablet Take 50 mg by mouth daily.   Yes Historical Provider, MD  torsemide (DEMADEX) 100 MG tablet Take 50 mg by mouth daily.   Yes Historical Provider, MD  warfarin (COUMADIN) 5 MG tablet Take 5 mg by mouth daily.   Yes Historical Provider, MD  zolpidem (AMBIEN) 10 MG tablet Take 10 mg by mouth at bedtime.     Yes Historical Provider, MD  cefpodoxime (VANTIN) 100 MG tablet Take 2 tablets (200 mg total) by mouth 2 (two) times daily. For 5 more days Patient not taking: Reported on 05/22/2016 06/27/14   Bonnielee Haff, MD  oxyCODONE (OXY IR/ROXICODONE) 5 MG immediate release tablet Take 1 tablet (5 mg total) by mouth every 4 (four) hours as needed for severe pain. Patient not taking: Reported on 05/22/2016 06/27/14   Bonnielee Haff, MD  torsemide (DEMADEX) 20 MG tablet Take 40mg  once daily for now till seen in follow up by your cardiologist or PCP. Patient not taking: Reported on 05/22/2016 06/27/14   Bonnielee Haff, MD   Dg Chest 1 View  Result Date: 05/22/2016 CLINICAL DATA:  Cough, pneumonia. EXAM: CHEST 1 VIEW COMPARISON:  Radiograph of June 26, 2014. FINDINGS:  Stable cardiomegaly. Right-sided pacemaker is unchanged in position. No pneumothorax or pleural effusion is noted. No acute pulmonary disease is noted. Bony thorax is unremarkable. IMPRESSION: No acute cardiopulmonary abnormality seen. Electronically Signed   By: Marijo Conception, M.D.   On: 05/22/2016 19:30   Ct Head Wo Contrast  Result Date: 05/22/2016 CLINICAL DATA:  Status post fall today.  History of prostate cancer. EXAM: CT HEAD WITHOUT CONTRAST TECHNIQUE: Contiguous axial images were obtained from the base of the skull through the vertex without intravenous contrast. COMPARISON:  Head CT scan  08/22/2014. FINDINGS: There is some cortical atrophy and chronic microvascular ischemic change. No evidence of acute intracranial abnormality including hemorrhage, infarct, mass lesion, mass effect, midline shift or abnormal extra-axial fluid collection is seen. No hydrocephalus or pneumocephalus. The calvarium is intact. Imaged paranasal sinuses and mastoid air cells are clear. IMPRESSION: No acute abnormality. Atrophy and chronic microvascular ischemic change. Electronically Signed   By: Inge Rise M.D.   On: 05/22/2016 19:58   Dg Hip Unilat With Pelvis 2-3 Views Right  Result Date: 05/22/2016 CLINICAL DATA:  Status post fall today with onset of right hip pain. Initial encounter. EXAM: DG HIP (WITH OR WITHOUT PELVIS) 2-3V RIGHT COMPARISON:  CT pelvis 08/22/2014. FINDINGS: Linear lucency is seen in the greater trochanter extending toward the lesser trochanter and new since the prior examination consistent with acute fracture. The fracture appears incomplete. The patient has remote healed intertrochanteric fracture. Fixation hardware is in place without complicating feature. IMPRESSION: Acute, incomplete fracture of the right greater trochanter. Remote healed intertrochanteric fracture with fixation hardware in place noted. Electronically Signed   By: Inge Rise M.D.   On: 05/22/2016 19:33   -  pertinent xrays, CT, MRI studies were reviewed and independently interpreted  Positive ROS: All other systems have been reviewed and were otherwise negative with the exception of those mentioned in the HPI and as above.  Physical Exam: General: Patient is not oriented, no acute distress Psychiatric: Patient is competent for consent with normal mood and affect Lymphatic: No axillary or cervical lymphadenopathy Cardiovascular: Patient has edema in both lower extremities. Respiratory: Patient has shortness of breath while seated. GI: No organomegaly, abdomen is soft and non-tender  Skin: Patient has no lesions or skin breakdown.   Neurologic: Patient does not have protective sensation bilateral lower extremities.   MUSCULOSKELETAL:  Objective patient is able to sit on his hip without pain. He has no pain with range of motion of the hip. Review of the CT scan and plain radiographs shows a fracture of the greater trochanter which is not displaced the internal fixation of the trochanteric nail is stable  Assessment: Assessment: Stable trochanteric nail with incomplete fracture of the greater trochanter.  Plan: Plan: Patient may begin ambulating weightbearing as tolerated. The intramedullary nail will protect the fracture and should not allow further propagation. Patient may require discharge to skilled nursing. I will follow-up as an outpatient. Orders written for physical therapy progressive ambulation weightbearing as tolerated and skilled nursing placement.  Thank you for the consult and the opportunity to see Mr. [place], [place] [phone] 3:34 PM

## 2016-05-23 NOTE — ED Notes (Signed)
Please call patient's daughter, Jaynee Eagles, before calling patient's wife! She may be reached at [phone]

## 2016-05-24 DIAGNOSIS — L8990 Pressure ulcer of unspecified site, unspecified stage: Secondary | ICD-10-CM | POA: Insufficient documentation

## 2016-05-24 LAB — GLUCOSE, CAPILLARY
GLUCOSE-CAPILLARY: 155 mg/dL — AB (ref 65–99)
GLUCOSE-CAPILLARY: 208 mg/dL — AB (ref 65–99)
GLUCOSE-CAPILLARY: 179 mg/dL — AB (ref 65–99)
Glucose-Capillary: 163 mg/dL — ABNORMAL HIGH (ref 65–99)

## 2016-05-24 LAB — HEMOGLOBIN A1C
Hgb A1c MFr Bld: 6.8 % — ABNORMAL HIGH (ref 4.8–5.6)
Mean Plasma Glucose: 148 mg/dL

## 2016-05-24 LAB — BASIC METABOLIC PANEL
ANION GAP: 11 (ref 5–15)
BUN: 23 mg/dL — AB (ref 6–20)
CHLORIDE: 107 mmol/L (ref 101–111)
CO2: 25 mmol/L (ref 22–32)
Calcium: 9.1 mg/dL (ref 8.9–10.3)
Creatinine, Ser: 1.14 mg/dL (ref 0.61–1.24)
GFR calc Af Amer: >60 mL/min (ref >60)
GFR calc non Af Amer: >60 mL/min (ref >60)
Glucose, Bld: 169 mg/dL — ABNORMAL HIGH (ref 65–99)
POTASSIUM: 3.3 mmol/L — AB (ref 3.5–5.1)
SODIUM: 143 mmol/L (ref 135–145)

## 2016-05-24 LAB — CBC
HEMATOCRIT: 36.6 % — AB (ref 39.0–52.0)
HEMOGLOBIN: 11.8 g/dL — AB (ref 13.0–17.0)
MCH: 31.4 pg (ref 26.0–34.0)
MCHC: 32.2 g/dL (ref 30.0–36.0)
MCV: 97.3 fL (ref 78.0–100.0)
Platelets: 95 10*3/uL — ABNORMAL LOW (ref 150–400)
RBC: 3.76 MIL/uL — ABNORMAL LOW (ref 4.22–5.81)
RDW: 14.2 % (ref 11.5–15.5)
WBC: 3.3 10*3/uL — AB (ref 4.0–10.5)

## 2016-05-24 MED ORDER — SENNA 8.6 MG PO TABS
1.0000 | ORAL_TABLET | Freq: Two times a day (BID) | ORAL | Status: DC
  Administered 2016-05-24 – 2016-05-26 (×4): 8.6 mg via ORAL
  Filled 2016-05-24 (×4): qty 1

## 2016-05-24 MED ORDER — WARFARIN SODIUM 5 MG PO TABS
5.0000 mg | ORAL_TABLET | Freq: Once | ORAL | Status: AC
  Administered 2016-05-24: 5 mg via ORAL
  Filled 2016-05-24: qty 1

## 2016-05-24 MED ORDER — CARVEDILOL 3.125 MG PO TABS
3.1250 mg | ORAL_TABLET | Freq: Two times a day (BID) | ORAL | Status: DC
  Administered 2016-05-24 – 2016-05-26 (×4): 3.125 mg via ORAL
  Filled 2016-05-24 (×4): qty 1

## 2016-05-24 MED ORDER — TORSEMIDE 20 MG PO TABS
50.0000 mg | ORAL_TABLET | Freq: Every day | ORAL | Status: DC
  Administered 2016-05-24 – 2016-05-26 (×3): 50 mg via ORAL
  Filled 2016-05-24 (×3): qty 3

## 2016-05-24 MED ORDER — ALBUTEROL SULFATE (2.5 MG/3ML) 0.083% IN NEBU: 2.5000 mg | INHALATION_SOLUTION | Freq: Four times a day (QID) | RESPIRATORY_TRACT | Status: DC | PRN

## 2016-05-24 MED ORDER — LOSARTAN POTASSIUM 50 MG PO TABS
25.0000 mg | ORAL_TABLET | Freq: Every day | ORAL | Status: DC
  Administered 2016-05-25 – 2016-05-26 (×2): 25 mg via ORAL
  Filled 2016-05-24 (×2): qty 1

## 2016-05-24 MED ORDER — ROSUVASTATIN CALCIUM 20 MG PO TABS
20.0000 mg | ORAL_TABLET | Freq: Every day | ORAL | Status: DC
  Administered 2016-05-24 – 2016-05-25 (×2): 20 mg via ORAL
  Filled 2016-05-24 (×2): qty 1

## 2016-05-24 NOTE — Progress Notes (Signed)
Patient puts self on Home CPAP In bed and ready for his placement.

## 2016-05-24 NOTE — Progress Notes (Addendum)
[facility] PROGRESS NOTE  Cameron Acosta [id number] DOB: 07-17-1936 DOA: 05/22/2016 PCP: Liliane Shi, MD   Brief summary  80 y.o. male with PMH of CAD, A fib (on coumadin), CHF, Bi-V ICD, CKD, Cirrhosis 2/2 NASH,  OSA on CPAP, hx of prostate cancer s/p XRT and hx of bladder cancer s/p TUR bladder tumor (on 01/01/16), bronchial lesion/lytic t4 lesion, chronic foley cath, recently admitted to [facility] for pneumonia, delirium and GI bleed discharged to rehab. He presented to emergency room after a fall. Found to have incomplete right hip fracture. Orthopedics recommended physical therapy ambulating weightbearing as tolerated. Patient also has h/o intermittent delirium. On admission, noted to be confused. Neuro exam was non focal, ct head: no acute infarcts. He is started on iv antibiotics for urinary tract infections. Also discontinued methadone, avoiding benzodiazepines, sitter at bedside    Assessment/Plan:  Fall. Acute, incomplete fracture of the right greater trochanter. Patient is not well ambulatory at baseline.  -Orthopedics recommended physical therapy ambulating weightbearing as tolerated. Will obtain PT/OT.   Delirium. D/w patient, his wife, daughter. They report worsening delirium after starting methadone, ativan for 1 week at [facility]  -neuro exam is non focal. CT head: no acute infarcts, but atrophy. Will hold opioids, benzo. Treat UTI  Left lower lobe central obstructing lesion with ill defined T4 lytic lesion. Patient, family has follow up apointment at [facility] in august  UTI. Started on iv ceftriaxone 8/11>>. Will exchange foley cath  CAD, A fib (on coumadin), CHF, Bi-V ICD. No acute chest pains. Will d/c IVF. D/w patient, his family at length. They have requested to restart coumadin, patient was already on coumadin at rehab. No s/s of bleeding   D/w patient, his wife and daughter,  wants to go home   Code Status: DNR Family Communication:  d/w patient, his wife, daughter  (indicate person spoken with, relationship, and if by phone, the number) Disposition Plan: pend PT. Family wants to take him home    Consultants:  Ortho   Procedures:  none  Antibiotics:  Ceftriaxone 8/11>>> (indicate start date, and stop date if known)  HPI/Subjective: Alert, confused to time   Objective: Vitals:   05/24/16 0618 05/24/16 0943  BP: (!) 104/45   Pulse: 69 68  Resp: 18 18  Temp: (!) 94.8 F (34.9 C)     Intake/Output Summary (Last 24 hours) at 05/24/16 1057 Last data filed at 05/24/16 0956  Gross per 24 hour  Intake              410 ml  Output             1375 ml  Net             -965 ml   Filed Weights   05/22/16 1714  Weight: 116.1 kg (256 lb)    Exam:   General:  Comfortable   Cardiovascular: s1,s2 rrr  Respiratory: CTA BL   Abdomen: soft, tn, nd   Musculoskeletal: no leg edema    Data Reviewed: Basic Metabolic Panel:  Recent Labs Lab 05/22/16 1902 05/23/16 0423 05/24/16 0646  NA 137 141 143  K 3.5 3.0* 3.3*  CL 102 105 107  CO2 26 25 25   GLUCOSE 243* 173* 169*  BUN 50* 40* 23*  CREATININE 1.95* 1.42* 1.14  CALCIUM 9.2 8.9 9.1   Liver Function Tests:  Recent Labs Lab 05/22/16 1902  AST 27  ALT 21  ALKPHOS 102  BILITOT 1.6*  PROT 7.3  ALBUMIN  3.4*   No results for input(s): LIPASE, AMYLASE in the last 168 hours.  Recent Labs Lab 05/22/16 1902  AMMONIA 23   CBC:  Recent Labs Lab 05/22/16 1902 05/24/16 0646  WBC 7.1 3.3*  NEUTROABS 5.1  --   HGB 12.0* 11.8*  HCT 34.3* 36.6*  MCV 95.8 97.3  PLT 122* 95*   Cardiac Enzymes:  Recent Labs Lab 05/22/16 1902  TROPONINI 0.03*   BNP (last 3 results) No results for input(s): BNP in the last 8760 hours.  ProBNP (last 3 results) No results for input(s): PROBNP in the last 8760 hours.  CBG:  Recent Labs Lab 05/23/16 0809 05/23/16 1237 05/23/16 1729 05/23/16 2135 05/24/16 0750  GLUCAP 189* 196* 188* 145* 163*     Recent Results (from the past 240 hour(s))  MRSA PCR Screening     Status: None   Collection Time: 05/23/16 10:39 AM  Result Value Ref Range Status   MRSA by PCR NEGATIVE NEGATIVE Final    Comment:        The GeneXpert MRSA Assay (FDA approved for NASAL specimens only), is one component of a comprehensive MRSA colonization surveillance program. It is not intended to diagnose MRSA infection nor to guide or monitor treatment for MRSA infections.      Studies: Dg Chest 1 View  Result Date: 05/22/2016 CLINICAL DATA:  Cough, pneumonia. EXAM: CHEST 1 VIEW COMPARISON:  Radiograph of June 26, 2014. FINDINGS: Stable cardiomegaly. Right-sided pacemaker is unchanged in position. No pneumothorax or pleural effusion is noted. No acute pulmonary disease is noted. Bony thorax is unremarkable. IMPRESSION: No acute cardiopulmonary abnormality seen. Electronically Signed   By: Marijo Conception, M.D.   On: 05/22/2016 19:30   Ct Head Wo Contrast  Result Date: 05/22/2016 CLINICAL DATA:  Status post fall today.  History of prostate cancer. EXAM: CT HEAD WITHOUT CONTRAST TECHNIQUE: Contiguous axial images were obtained from the base of the skull through the vertex without intravenous contrast. COMPARISON:  Head CT scan 08/22/2014. FINDINGS: There is some cortical atrophy and chronic microvascular ischemic change. No evidence of acute intracranial abnormality including hemorrhage, infarct, mass lesion, mass effect, midline shift or abnormal extra-axial fluid collection is seen. No hydrocephalus or pneumocephalus. The calvarium is intact. Imaged paranasal sinuses and mastoid air cells are clear. IMPRESSION: No acute abnormality. Atrophy and chronic microvascular ischemic change. Electronically Signed   By: Inge Rise M.D.   On: 05/22/2016 19:58   Dg Hip Unilat With Pelvis 2-3 Views Right  Result Date: 05/22/2016 CLINICAL DATA:  Status post fall today with onset of right hip pain. Initial  encounter. EXAM: DG HIP (WITH OR WITHOUT PELVIS) 2-3V RIGHT COMPARISON:  CT pelvis 08/22/2014. FINDINGS: Linear lucency is seen in the greater trochanter extending toward the lesser trochanter and new since the prior examination consistent with acute fracture. The fracture appears incomplete. The patient has remote healed intertrochanteric fracture. Fixation hardware is in place without complicating feature. IMPRESSION: Acute, incomplete fracture of the right greater trochanter. Remote healed intertrochanteric fracture with fixation hardware in place noted. Electronically Signed   By: Inge Rise M.D.   On: 05/22/2016 19:33    Scheduled Meds: . budesonide (PULMICORT) nebulizer solution  0.5 mg Nebulization BID  . cefTRIAXone (ROCEPHIN)  IV  1 g Intravenous Q24H  . gabapentin  100 mg Oral QHS  . hydrALAZINE  25 mg Oral BID  . insulin aspart  0-15 Units Subcutaneous TID WC  . pantoprazole  40 mg Oral Daily  .  Warfarin - Pharmacist Dosing Inpatient   Does not apply q1800   Continuous Infusions:    Principal Problem:   Delirium Active Problems:   CKD (chronic kidney disease) stage 3, GFR 30-59 ml/min   NASH (nonalcoholic steatohepatitis)   DM type 2 causing renal disease (HCC)   Closed nondisplaced fracture of greater trochanter of right femur (HCC)   Pressure ulcer    Time spent: >35 minutes     Kinnie Feil  [facility] Pager [phone]. If 7PM-7AM, please contact night-coverage at [email or web address], password [facility] 05/24/2016, 10:57 AM  LOS: 1 day

## 2016-05-24 NOTE — Progress Notes (Signed)
No restraint order initiated during this shift. Will continue to monitor.

## 2016-05-24 NOTE — Progress Notes (Signed)
The restraint order wasn't initiated. At this time, patient is resting quietly in bed. Will continue to monitor.

## 2016-05-24 NOTE — Evaluation (Signed)
Physical Therapy Evaluation Patient Details Name: Cameron Acosta MRN: [id number] DOB: 07-Mar-1936 Today's Date: 05/24/2016   History of Present Illness  Patient is a 79 yo male admitted 05/22/16 after fall with incomplete Rt hip fx.  Patient also with delerium/confusion.   Per ortho, no surgery indicated; ambulate WBAT.   PMH:  recently admitted to [facility] for pneumonia, delirium and GI bleed discharged to rehab, CAD, A fib (on coumadin), CHF, Bi-V ICD, CKD, Cirrhosis 2/2 NASH,  OSA on CPAP, hx of prostate cancer s/p XRT and hx of bladder cancer s/p TUR bladder tumor (on 01/01/16), bronchial lesion/lytic t4 lesion, chronic foley cath    Clinical Impression  Patient presents with problems listed below.  Will benefit from acute PT to maximize functional mobility prior to discharge home with family.  Patient requiring up to max assist for mobility today.  Will need to progress to min assist at least to go home safely with wife.  Recommend HHPT for continued therapy and HH Aide for assist with ADL's at d/c.  If patient unable to reach min assist level, may need to consider SNF.    Follow Up Recommendations Home health PT;Supervision/Assistance - 24 hour;Other (comment) ([place])    Equipment Recommendations  Wheelchair (measurements PT);Wheelchair cushion (measurements PT)    Recommendations for Other Services       Precautions / Restrictions Precautions Precautions: Fall Restrictions Weight Bearing Restrictions: Yes RLE Weight Bearing: Weight bearing as tolerated      Mobility  Bed Mobility Overal bed mobility: Needs Assistance Bed Mobility: Rolling;Sidelying to Sit Rolling: Min assist Sidelying to sit: Max assist       General bed mobility comments: Verbal and tactile cues to hold rail with RUE to roll to Lt, with min assist to facilitate reach and to roll hips.  Max assist to move trunk to sitting position.  Once upright, patient able to maintain balance with UE  support and min guard assist.   Transfers Overall transfer level: Needs assistance Equipment used: Rolling walker (2 wheeled) Transfers: Sit to/from Stand Sit to Stand: Mod assist;+2 physical assistance;From elevated surface         General transfer comment: Patient initially tried to stand pulling on RW.  Verbal and tactile cues to move hands to bed to push up to standing.  Required mod assist to rise to standing.  Min assist for balance once upright.  Ambulation/Gait Ambulation/Gait assistance: Min assist;+2 safety/equipment Ambulation Distance (Feet): 64 Feet Assistive device: Rolling walker (2 wheeled) Gait Pattern/deviations: Step-through pattern;Decreased step length - right;Decreased step length - left;Decreased stride length;Shuffle;Trunk flexed Gait velocity: decreased Gait velocity interpretation: Below normal speed for age/gender General Gait Details: Verbal cues for safe use of RW.  Cues to stand upright and stay close to RW.  Assist for balance/safety with chair close behind.  Stairs            Wheelchair Mobility    Modified Rankin (Stroke Patients Only)       Balance Overall balance assessment: Needs assistance Sitting-balance support: Single extremity supported;Feet supported Sitting balance-Leahy Scale: Fair     Standing balance support: Bilateral upper extremity supported Standing balance-Leahy Scale: Poor                               Pertinent Vitals/Pain Pain Assessment: Faces Faces Pain Scale: Hurts little more Pain Location: low back Pain Descriptors / Indicators: Aching Pain Intervention(s): Monitored during session;Repositioned  Home Living Family/patient expects to be discharged to:: Private residence Living Arrangements: Spouse/significant other Available Help at Discharge: Family;Available 24 hours/day (Daughter to help for short period, then just wife) Type of Home: House Home Access: Stairs to enter Entrance  Stairs-Rails: Psychiatric nurse of Steps: 3 Home Layout: Two level;Able to live on main level with bedroom/bathroom Home Equipment: Gilford Rile - 2 wheels;Cane - single point;Bedside commode;Shower seat - built in      Prior Function Level of Independence: Independent with assistive device(s)         Comments: Used cane or RW.  Independent with ADL's     Hand Dominance        Extremity/Trunk Assessment   Upper Extremity Assessment: Generalized weakness;Defer to OT evaluation           Lower Extremity Assessment: Generalized weakness      Cervical / Trunk Assessment: Kyphotic  Communication   Communication: No difficulties  Cognition Arousal/Alertness: Awake/alert Behavior During Therapy: WFL for tasks assessed/performed;Impulsive Overall Cognitive Status: Impaired/Different from baseline Area of Impairment: Memory;Following commands;Safety/judgement;Problem solving;Awareness;Orientation Orientation Level: Disoriented to;Situation;Time   Memory: Decreased short-term memory Following Commands: Follows multi-step commands with increased time Safety/Judgement: Decreased awareness of deficits;Decreased awareness of safety   Problem Solving: Slow processing;Difficulty sequencing;Requires verbal cues;Requires tactile cues General Comments: Patient reports he hurt his left leg.      General Comments      Exercises        Assessment/Plan    PT Assessment Patient needs continued PT services  PT Diagnosis Difficulty walking;Generalized weakness;Acute pain;Altered mental status   PT Problem List Decreased strength;Decreased activity tolerance;Decreased balance;Decreased mobility;Decreased cognition;Decreased knowledge of use of DME;Decreased safety awareness;Pain  PT Treatment Interventions DME instruction;Gait training;Stair training;Functional mobility training;Therapeutic activities;Therapeutic exercise;Cognitive remediation;Patient/family education    PT Goals (Current goals can be found in the Care Plan section) Acute Rehab PT Goals Patient Stated Goal: To go home PT Goal Formulation: With patient/family Time For Goal Achievement: 05/31/16 Potential to Achieve Goals: Good    Frequency Min 5X/week   Barriers to discharge Decreased caregiver support Unsure of wife's ability to provide assist patient currently requires.    Co-evaluation               End of Session Equipment Utilized During Treatment: Gait belt Activity Tolerance: Patient limited by fatigue Patient left: in chair;with call bell/phone within reach;with chair alarm set;with nursing/sitter in room Nurse Communication: Mobility status         Time: 1354-1415 PT Time Calculation (min) (ACUTE ONLY): 21 min   Charges:   PT Evaluation $PT Eval Moderate Complexity: 1 Procedure     PT G CodesDespina Pole 05/27/2016, 4:25 PM Carita Pian. Sanjuana Kava, [place] Pager [phone]

## 2016-05-24 NOTE — Progress Notes (Signed)
ANTICOAGULATION CONSULT NOTE - Follow Up Consult  Pharmacy Consult for Coumadin Indication: atrial fibrillation  Allergies  Allergen Reactions  . Povidone Iodine Itching  . Zoledronic Acid Other (See Comments)    Flu-like symptoms with-in 4 hours of receiving medidcation  . Contrast Media [Iodinated Diagnostic Agents] Other (See Comments)    Unknown     Patient Measurements: Height: 5\' 7"  (170.2 cm) Weight: 256 lb (116.1 kg) IBW/kg (Calculated) : 66.1  Vital Signs: Temp: 94.8 F (34.9 C) (08/12 0618) Temp Source: Oral (08/12 0618) BP: 104/45 (08/12 0618) Pulse Rate: 68 (08/12 0943)  Labs:  Recent Labs  05/22/16 1902 05/23/16 0423 05/24/16 0646  HGB 12.0*  --  11.8*  HCT 34.3*  --  36.6*  PLT 122*  --  95*  LABPROT 16.2*  --   --   INR 1.29  --   --   CREATININE 1.95* 1.42* 1.14  TROPONINI 0.03*  --   --     Estimated Creatinine Clearance: 66.1 mL/min (by C-G formula based on SCr of 1.14 mg/dL).   Assessment: 80 year old male admitted from rehab after a fall and found to have non-displaced R-hip fracture. No surgical plans per Dr. Daleen Bo and plan to resume Coumadin per pharmacy dosing. Spoke with patient's daughter (also a Marine scientist) on 8/11 and she reported that patient has been refusing Coumadin as well as other medications and diet at the rehab facility. Per information from rehab facility - he was to be on 5mg  daily and they recorded the last dose at 8/10.   INR was sub-therapeutic at 1.29. Starting Rocephin for a UTI.  No INR available today.CBC is stable. Platelets are low (95) -chronic issue secondary to cirrhosis. Will monitor closely. LFTs on admission are within normal limits.   Goal of Therapy:  INR 2-3 Monitor platelets by anticoagulation protocol: Yes   Plan:  Warfarin 5 mg again tonight per prior dosing. Follow-up INR in AM.   Sloan Leiter, PharmD, BCPS Clinical Pharmacist [phone] 05/24/2016,11:53 AM

## 2016-05-25 LAB — CBC
HEMATOCRIT: 31.8 % — AB (ref 39.0–52.0)
Hemoglobin: 10.4 g/dL — ABNORMAL LOW (ref 13.0–17.0)
MCH: 32 pg (ref 26.0–34.0)
MCHC: 32.7 g/dL (ref 30.0–36.0)
MCV: 97.8 fL (ref 78.0–100.0)
PLATELETS: 109 10*3/uL — AB (ref 150–400)
RBC: 3.25 MIL/uL — ABNORMAL LOW (ref 4.22–5.81)
RDW: 14 % (ref 11.5–15.5)
WBC: 4.5 10*3/uL (ref 4.0–10.5)

## 2016-05-25 LAB — GLUCOSE, CAPILLARY
GLUCOSE-CAPILLARY: 234 mg/dL — AB (ref 65–99)
Glucose-Capillary: 168 mg/dL — ABNORMAL HIGH (ref 65–99)
Glucose-Capillary: 206 mg/dL — ABNORMAL HIGH (ref 65–99)
Glucose-Capillary: 157 mg/dL — ABNORMAL HIGH (ref 65–99)

## 2016-05-25 LAB — PROTIME-INR
INR: 1.62
Prothrombin Time: 19.4 seconds — ABNORMAL HIGH (ref 11.4–15.2)

## 2016-05-25 MED ORDER — QUETIAPINE FUMARATE 25 MG PO TABS
25.0000 mg | ORAL_TABLET | Freq: Every day | ORAL | Status: DC
  Administered 2016-05-25: 25 mg via ORAL
  Filled 2016-05-25: qty 1

## 2016-05-25 MED ORDER — WARFARIN SODIUM 5 MG PO TABS
5.0000 mg | ORAL_TABLET | Freq: Once | ORAL | Status: AC
  Administered 2016-05-25: 5 mg via ORAL
  Filled 2016-05-25: qty 1

## 2016-05-25 NOTE — Progress Notes (Signed)
Physical Therapy Treatment Patient Details Name: Cameron Acosta MRN: [id number] DOB: [birth year] Today's Date: 05/25/2016    History of Present Illness Patient is a 80 yo male admitted 05/22/16 after fall with incomplete Rt hip fx.  Patient also with delerium/confusion.   Per ortho, no surgery indicated; ambulate WBAT.   PMH:  recently admitted to [facility] for pneumonia, delirium and GI bleed discharged to rehab, CAD, A fib (on coumadin), CHF, Bi-V ICD, CKD, Cirrhosis 2/2 NASH,  OSA on CPAP, hx of prostate cancer s/p XRT and hx of bladder cancer s/p TUR bladder tumor (on 01/01/16), bronchial lesion/lytic t4 lesion, chronic foley cath    PT Comments    Patient with improved mobility and gait today, requiring less assistance.  Continue to recommend HHPT.  Follow Up Recommendations  Home health PT;Supervision/Assistance - 24 hour;Other (comment) ([place])     Equipment Recommendations  Wheelchair (measurements PT);Wheelchair cushion (measurements PT)    Recommendations for Other Services       Precautions / Restrictions Precautions Precautions: Fall Restrictions Weight Bearing Restrictions: Yes RLE Weight Bearing: Weight bearing as tolerated    Mobility  Bed Mobility Overal bed mobility: Needs Assistance Bed Mobility: Rolling;Sidelying to Sit;Sit to Supine Rolling: Supervision Sidelying to sit: Min assist   Sit to supine: Min assist   General bed mobility comments: Verbal cues for technique.  Bed flat with no rail, patient able to move to sitting with min assist to raise trunk.  Required repeated verbal cues to use UE's to push on bed.  Repeatedly wanted to pull up using PT's arm.  Patient required min assist to bring LE's onto bed to return to supine.  Did use rails to scoot up in bed.  Transfers Overall transfer level: Needs assistance Equipment used: Rolling walker (2 wheeled) Transfers: Sit to/from Stand Sit to Stand: Mod assist         General  transfer comment: Verbal cues for hand placement.  Initially, patient required min assist to rise to standing.  However, when he reached from bed to RW, he required mod assist to remain upright.  Once standing, min assist for balance.  Ambulation/Gait Ambulation/Gait assistance: Min guard Ambulation Distance (Feet): 90 Feet Assistive device: Rolling walker (2 wheeled) Gait Pattern/deviations: Step-through pattern;Step-to pattern;Decreased step length - left;Decreased stance time - right;Decreased stride length;Decreased weight shift to right Gait velocity: decreased Gait velocity interpretation: Below normal speed for age/gender General Gait Details: Patient using RW correctly today, remaining inside walker.  Patient having some difficulty with clearing Lt foot during swing phase, especially at end of walk.  Potentially difficulty with stance on RLE causing issue.  Cues to step through with LLE.   Stairs            Wheelchair Mobility    Modified Rankin (Stroke Patients Only)       Balance           Standing balance support: Bilateral upper extremity supported;During functional activity Standing balance-Leahy Scale: Poor                      Cognition Arousal/Alertness: Awake/alert Behavior During Therapy: WFL for tasks assessed/performed;Restless;Impulsive Overall Cognitive Status: Impaired/Different from baseline Area of Impairment: Memory;Following commands;Safety/judgement;Problem solving;Awareness;Orientation Orientation Level: Disoriented to;Situation;Time   Memory: Decreased short-term memory Following Commands: Follows multi-step commands with increased time Safety/Judgement: Decreased awareness of deficits;Decreased awareness of safety   Problem Solving: Slow processing;Difficulty sequencing;Requires verbal cues;Requires tactile cues General Comments: Continues to be restless and  impulsive.  Decreased safety awareness.    Exercises General Exercises  - Lower Extremity Ankle Circles/Pumps: AROM;Both;10 reps;Supine Quad Sets: AROM;Both;10 reps;Supine Short Arc Quad: AROM;Both;10 reps;Supine Heel Slides: AROM;Both;10 reps;Supine Hip ABduction/ADduction: AROM;Both;10 reps;Supine    General Comments        Pertinent Vitals/Pain Pain Assessment: Faces Faces Pain Scale: Hurts a little bit Pain Location: Rt hip/leg Pain Descriptors / Indicators: Sore Pain Intervention(s): Monitored during session;Repositioned    Home Living                      Prior Function            PT Goals (current goals can now be found in the care plan section) Progress towards PT goals: Progressing toward goals    Frequency  Min 5X/week    PT Plan Current plan remains appropriate    Co-evaluation             End of Session Equipment Utilized During Treatment: Gait belt Activity Tolerance: Patient tolerated treatment well Patient left: in bed;with call bell/phone within reach;with bed alarm set;with nursing/sitter in room     Time: [id number] PT Time Calculation (min) (ACUTE ONLY): 44 min  Charges:  $Gait Training: 23-37 mins $Therapeutic Exercise: 8-22 mins                    G Codes:      Despina Pole May 28, 2016, 5:57 PM Carita Pian. Sanjuana Kava, [place] Pager [phone]

## 2016-05-25 NOTE — Progress Notes (Signed)
Patient placed on CPAP and using his home nasal prongs. Tolerating well and will call if any further assistance needed

## 2016-05-25 NOTE — Progress Notes (Signed)
Per patient's wife request we took off the chair alarm since she is in room with him.  Will continue to monitor.

## 2016-05-25 NOTE — Progress Notes (Signed)
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 male with PMH of CAD, A fib (on coumadin), CHF, Bi-V ICD, CKD, Cirrhosis 2/2 NASH,  OSA on CPAP, hx of prostate cancer s/p XRT and hx of bladder cancer s/p TURP bladder tumor (on 01/01/16) chronic foley cath, bronchial lesion/lytic t4 lesion, recently admitted to [facility] for pneumonia, delirium and GI bleed discharged to rehab. He presented to emergency room after a fall. Found to have incomplete right hip fracture. Orthopedics recommended physical therapy ambulating weightbearing as tolerated. Patient also has h/o intermittent delirium. On admission, noted to be confused. Neuro exam was non focal, ct head: no acute infarcts. Delirium-> somewhat improved after discontinued methadone,  benzodiazepines, but continuous to be delirious requiring a sitter at bedside. Consulted psychiatry evaluation.  Also started on iv antibiotics for urinary tract infections, pend cultures   Assessment/Plan:  Fall. Acute, incomplete fracture of the right greater trochanter. Patient is not well ambulatory at baseline.  -Orthopedics recommended physical therapy ambulating weightbearing as tolerated. cont PT/OT. Fall precautions.   Delirium. Ongoing since admission to [facility]. Thought due to scheduled opioids (he was discharged on methadone and benzodiazepines from [place]). Also has UTI. Questionable underlying MCI. neuro exam is non focal. CT head: no acute infarcts, but atrophy. Unable to obtain MRI due to PPM -somewhat improved in delirium with holding methadone and  benzo and treating UTI. D/w patient, his wife, daughter at length. Patient remains delirious at times, wants to go home.  They have requested psychiatry evaluation for competency. start Seroquel QHS  Left lower lobe central obstructing lesion with ill defined T4 lytic lesion. Patient, family has follow up apointment at [facility] in august  UTI. Started on iv ceftriaxone 8/11>>, pend cultures. Yeast likely from the foley cath/colonized, so exchanged foley cath.  CAD, A fib (on coumadin), CHF, Bi-V ICD. No acute chest pains. Will d/c IVF. D/w patient, his family at length. They have requested to restart coumadin, patient was already on coumadin at rehab. No s/s of bleeding   Disposition: D/w patient, his wife and daughter. He patient wants to go home tomorrow. Wife plans to take him home with HHC/PT but unclear if able to management him at home. Await psychiatry eval   Code Status: DNR Family Communication: d/w patient, his wife, daughter  (indicate person spoken with, relationship, and if by phone, the number) Disposition Plan: probable on Monday, pend urine cultures.  Family wants to take him home    Consultants:  Ortho   Procedures:  none  Antibiotics:  Ceftriaxone 8/11>>> (indicate start date, and stop date if known)  HPI/Subjective: Alert, confused to time, had episode of delirium last night. Denies acute pains, no hallucinations on my questioning.   Objective: Vitals:   05/24/16 2121 05/25/16 0452  BP: (!) 149/107 131/67  Pulse: 70 70  Resp: 19 14  Temp:  98.4 F (36.9 C)    Intake/Output Summary (Last 24 hours) at 05/25/16 1215 Last data filed at 05/25/16 0800  Gross per 24 hour  Intake              250 ml  Output  2150 ml  Net            -1900 ml   Filed Weights   05/22/16 1714  Weight: 116.1 kg (256 lb)    Exam:   General:  Comfortable   Cardiovascular: s1,s2 rrr  Respiratory: CTA BL   Abdomen: soft, tn, nd   Musculoskeletal: no leg edema     Data Reviewed: Basic Metabolic Panel:  Recent Labs Lab 05/22/16 1902 05/23/16 0423 05/24/16 0646  NA 137 141 143  K 3.5 3.0* 3.3*  CL 102 105 107  CO2 26 25 25   GLUCOSE 243* 173* 169*  BUN 50* 40* 23*  CREATININE 1.95* 1.42* 1.14  CALCIUM 9.2 8.9 9.1   Liver Function Tests:  Recent Labs Lab 05/22/16 1902  AST 27  ALT 21  ALKPHOS 102  BILITOT 1.6*  PROT 7.3  ALBUMIN 3.4*   No results for input(s): LIPASE, AMYLASE in the last 168 hours.  Recent Labs Lab 05/22/16 1902  AMMONIA 23   CBC:  Recent Labs Lab 05/22/16 1902 05/24/16 0646 05/25/16 0448  WBC 7.1 3.3* 4.5  NEUTROABS 5.1  --   --   HGB 12.0* 11.8* 10.4*  HCT 34.3* 36.6* 31.8*  MCV 95.8 97.3 97.8  PLT 122* 95* 109*   Cardiac Enzymes:  Recent Labs Lab 05/22/16 1902  TROPONINI 0.03*   BNP (last 3 results) No results for input(s): BNP in the last 8760 hours.  ProBNP (last 3 results) No results for input(s): PROBNP in the last 8760 hours.  CBG:  Recent Labs Lab 05/24/16 1132 05/24/16 1733 05/24/16 2123 05/25/16 0734 05/25/16 1129  GLUCAP 155* 208* 179* 168* 234*    Recent Results (from the past 240 hour(s))  MRSA PCR Screening     Status: None   Collection Time: 05/23/16 10:39 AM  Result Value Ref Range Status   MRSA by PCR NEGATIVE NEGATIVE Final    Comment:        The GeneXpert MRSA Assay (FDA approved for NASAL specimens only), is one component of a comprehensive MRSA colonization surveillance program. It is not intended to diagnose MRSA infection nor to guide or monitor treatment for MRSA infections.   Culture, Urine     Status: Abnormal (Preliminary result)   Collection Time: 05/23/16  2:11 PM  Result Value Ref Range Status   Specimen Description URINE, CLEAN CATCH  Final   Special Requests NONE  Final   Culture (A)  Final    >=100,000 COLONIES/mL GRAM POSITIVE COCCI >=100,000 COLONIES/mL YEAST IDENTIFICATION AND SUSCEPTIBILITIES TO FOLLOW    Report Status  PENDING  Incomplete     Studies: No results found.  Scheduled Meds: . budesonide (PULMICORT) nebulizer solution  0.5 mg Nebulization BID  . carvedilol  3.125 mg Oral BID WC  . cefTRIAXone (ROCEPHIN)  IV  1 g Intravenous Q24H  . insulin aspart  0-15 Units Subcutaneous TID WC  . losartan  25 mg Oral Daily  . pantoprazole  40 mg Oral Daily  . rosuvastatin  20 mg Oral q1800  . senna  1 tablet Oral BID  . torsemide  50 mg Oral Daily  . warfarin  5 mg Oral ONCE-1800  . Warfarin - Pharmacist Dosing Inpatient   Does not apply q1800   Continuous Infusions:       Time spent: >35 minutes     Kinnie Feil  [facility] Pager [phone]. If 7PM-7AM, please contact night-coverage at [email or web address], password [facility] 05/25/2016, 12:15 PM  LOS: 2  days

## 2016-05-25 NOTE — Progress Notes (Signed)
ANTICOAGULATION CONSULT NOTE - Follow Up Consult  Pharmacy Consult for Coumadin Indication: atrial fibrillation  Allergies  Allergen Reactions  . Povidone Iodine Itching  . Zoledronic Acid Other (See Comments)    Flu-like symptoms with-in 4 hours of receiving medidcation  . Contrast Media [Iodinated Diagnostic Agents] Other (See Comments)    Unknown     Patient Measurements: Height: 5\' 7"  (170.2 cm) Weight: 256 lb (116.1 kg) IBW/kg (Calculated) : 66.1  Vital Signs: Temp: 98.4 F (36.9 C) (08/13 0452) Temp Source: Oral (08/13 0452) BP: 131/67 (08/13 0452) Pulse Rate: 70 (08/13 0452)  Labs:  Recent Labs  05/22/16 1902 05/23/16 0423 05/24/16 0646 05/25/16 0448  HGB 12.0*  --  11.8* 10.4*  HCT 34.3*  --  36.6* 31.8*  PLT 122*  --  95* 109*  LABPROT 16.2*  --   --  19.4*  INR 1.29  --   --  1.62  CREATININE 1.95* 1.42* 1.14  --   TROPONINI 0.03*  --   --   --     Estimated Creatinine Clearance: 66.1 mL/min (by C-G formula based on SCr of 1.14 mg/dL).   Assessment: 80 year old male admitted from rehab after a fall and found to have non-displaced R-hip fracture. No surgical plans per Dr. Daleen Bo and plan to resume Coumadin per pharmacy dosing. Spoke with patient's daughter (also a Marine scientist) on 8/11 and she reported that patient has been refusing Coumadin as well as other medications and diet at the rehab facility. Per information from rehab facility - he was to be on 5mg  daily and they recorded the last dose at 8/10.   INR is sub-therapeutic at 1.62 today but trending up nicely.  H/H is stable. Platelets are low (109) -chronic issue secondary to cirrhosis. Will monitor closely. LFTs on admission are within normal limits.   Goal of Therapy:  INR 2-3 Monitor platelets by anticoagulation protocol: Yes   Plan:  Warfarin 5 mg again tonight. Daily PT/INR Watch platelets  Sloan Leiter, PharmD, BCPS Clinical Pharmacist [phone] 05/25/2016,11:05 AM

## 2016-05-26 DIAGNOSIS — R410 Disorientation, unspecified: Secondary | ICD-10-CM

## 2016-05-26 DIAGNOSIS — E1129 Type 2 diabetes mellitus with other diabetic kidney complication: Secondary | ICD-10-CM

## 2016-05-26 DIAGNOSIS — N183 Chronic kidney disease, stage 3 (moderate): Secondary | ICD-10-CM

## 2016-05-26 DIAGNOSIS — R4189 Other symptoms and signs involving cognitive functions and awareness: Secondary | ICD-10-CM

## 2016-05-26 LAB — GLUCOSE, CAPILLARY
GLUCOSE-CAPILLARY: 168 mg/dL — AB (ref 65–99)
GLUCOSE-CAPILLARY: 162 mg/dL — AB (ref 65–99)

## 2016-05-26 LAB — URINE CULTURE

## 2016-05-26 LAB — PROTIME-INR
INR: 2.05
PROTHROMBIN TIME: 23.5 s — AB (ref 11.4–15.2)

## 2016-05-26 MED ORDER — LOSARTAN POTASSIUM 25 MG PO TABS: 25.0000 mg | ORAL_TABLET | Freq: Every day | ORAL | 0 refills | Status: AC

## 2016-05-26 MED ORDER — WARFARIN SODIUM 5 MG PO TABS: 5.0000 mg | ORAL_TABLET | Freq: Once | ORAL | Status: DC

## 2016-05-26 MED ORDER — PANTOPRAZOLE SODIUM 40 MG PO TBEC: 40.0000 mg | DELAYED_RELEASE_TABLET | Freq: Every day | ORAL | 0 refills | Status: DC

## 2016-05-26 MED ORDER — AMOXICILLIN 500 MG PO CAPS
500.0000 mg | ORAL_CAPSULE | Freq: Three times a day (TID) | ORAL | Status: DC
  Administered 2016-05-26: 500 mg via ORAL
  Filled 2016-05-26 (×2): qty 1

## 2016-05-26 MED ORDER — CARVEDILOL 3.125 MG PO TABS: 3.1250 mg | ORAL_TABLET | Freq: Two times a day (BID) | ORAL | 0 refills | Status: AC

## 2016-05-26 MED ORDER — AMOXICILLIN 500 MG PO CAPS: 500.0000 mg | ORAL_CAPSULE | Freq: Three times a day (TID) | ORAL | 0 refills | Status: AC

## 2016-05-26 MED ORDER — QUETIAPINE FUMARATE 25 MG PO TABS: 25.0000 mg | ORAL_TABLET | Freq: Every day | ORAL | 0 refills | Status: AC

## 2016-05-26 NOTE — Progress Notes (Signed)
Nsg Discharge Note  Admit Date:  05/22/2016 Discharge date: 05/26/2016   Cameron Acosta to be D/C'd Home per MD order.  AVS completed.  Copy for chart, and copy for patient signed, and dated. Patient/caregiver able to verbalize understanding.  Discharge Medication:   Medication List    STOP taking these medications   cefpodoxime 100 MG tablet Commonly known as:  VANTIN   haloperidol 0.5 MG tablet Commonly known as:  HALDOL   hydrALAZINE 25 MG tablet Commonly known as:  APRESOLINE   LORazepam 2 MG/ML injection Commonly known as:  ATIVAN   methadone 5 MG tablet Commonly known as:  DOLOPHINE   oxyCODONE 5 MG immediate release tablet Commonly known as:  Oxy IR/ROXICODONE   sertraline 50 MG tablet Commonly known as:  ZOLOFT     TAKE these medications   albuterol 108 (90 Base) MCG/ACT inhaler Commonly known as:  PROVENTIL HFA;VENTOLIN HFA Inhale 2 puffs into the lungs every 6 (six) hours as needed for wheezing or shortness of breath.   amoxicillin 500 MG capsule Commonly known as:  AMOXIL Take 1 capsule (500 mg total) by mouth 3 (three) times daily.   arformoterol 15 MCG/2ML Nebu Commonly known as:  BROVANA Take 15 mcg by nebulization 2 (two) times daily.   aspirin 81 MG chewable tablet Chew 81 mg by mouth daily.   budesonide 0.5 MG/2ML nebulizer solution Commonly known as:  PULMICORT Take 0.5 mg by nebulization 2 (two) times daily.   carvedilol 3.125 MG tablet Commonly known as:  COREG Take 1 tablet (3.125 mg total) by mouth 2 (two) times daily with a meal. What changed:  medication strength  how much to take   DELSYM 30 MG/5ML liquid Generic drug:  dextromethorphan Take 60 mg by mouth 2 (two) times daily.   folic acid 1 MG tablet Commonly known as:  FOLVITE Take 1 mg by mouth daily.   gabapentin 100 MG capsule Commonly known as:  NEURONTIN Take 100 mg by mouth daily.   glipiZIDE 5 MG tablet Commonly known as:  GLUCOTROL Take 5-10 mg by mouth 2  (two) times daily before a meal. Take 2 tablets every morning and take 1 tablet every evening   guaiFENesin-codeine 100-10 MG/5ML syrup Take 10 mLs by mouth every 4 (four) hours as needed for cough.   HYDROcodone-acetaminophen 7.5-325 MG tablet Commonly known as:  NORCO Take 1 tablet by mouth every 4 (four) hours as needed for moderate pain.   losartan 25 MG tablet Commonly known as:  COZAAR Take 1 tablet (25 mg total) by mouth daily. What changed:  medication strength  how much to take  when to take this   multivitamin with minerals Tabs tablet Take 1 tablet by mouth daily.   omeprazole 20 MG capsule Commonly known as:  PRILOSEC Take 20 mg by mouth 2 (two) times daily before a meal.   QUEtiapine 25 MG tablet Commonly known as:  SEROQUEL Take 1 tablet (25 mg total) by mouth at bedtime.   rosuvastatin 20 MG tablet Commonly known as:  CRESTOR Take 20 mg by mouth daily.   thiamine 50 MG tablet Commonly known as:  VITAMIN B-1 Take 50 mg by mouth daily.   torsemide 100 MG tablet Commonly known as:  DEMADEX Take 50 mg by mouth daily. What changed:  Another medication with the same name was removed. Continue taking this medication, and follow the directions you see here.   warfarin 5 MG tablet Commonly known as:  COUMADIN Take 5  mg by mouth daily.   zolpidem 10 MG tablet Commonly known as:  AMBIEN Take 10 mg by mouth at bedtime.       Discharge Assessment: Vitals:   05/26/16 0400 05/26/16 1451  BP: 116/62 (!) 110/59  Pulse: 70 70  Resp: 18 20  Temp: 99.2 F (37.3 C) 97.4 F (36.3 C)   Skin clean, dry and intact without evidence of skin break down, no evidence of skin tears noted. IV catheter discontinued intact. Site without signs and symptoms of complications - no redness or edema noted at insertion site, patient denies c/o pain - only slight tenderness at site.  Dressing with slight pressure applied.  D/c Instructions-Education: Discharge instructions  given to patient/family with verbalized understanding. D/c education completed with patient/family including follow up instructions, medication list, d/c activities limitations if indicated, with other d/c instructions as indicated by MD - patient able to verbalize understanding, all questions fully answered. Patient instructed to return to ED, call 911, or call MD for any changes in condition.  Patient escorted via [place], and D/C home via private auto.  Dayle Points, RN 05/26/2016 3:09 PM

## 2016-05-26 NOTE — Consult Note (Signed)
[facility] Psychiatry Consult   Reason for Consult:  Competency  Referring Physician:  Dr. Karleen Hampshire Patient Identification: Cameron Acosta MRN:  [id number] Principal Diagnosis: Delirium Diagnosis:   Patient Active Problem List   Diagnosis Date Noted  . Pressure ulcer [L89.90] 05/24/2016  . Delirium [R41.0] 05/22/2016  . Closed nondisplaced fracture of greater trochanter of right femur ([place]) [S72.114A] 05/22/2016  . Hip fracture ([place]) [S72.009A] 08/22/2014  . Coagulopathy ([place]) [D68.9] 08/22/2014  . Intertrochanteric fracture of right femur ([place]) [S72.141A] 08/22/2014  . Left hemiparesis ([place]) [G81.94] 06/26/2014  . Cardiac pacemaker in situ [Z95.0] 06/26/2014  . AICD (automatic cardioverter/defibrillator) present [Z95.810] 06/26/2014  . History of prostate cancer [Z85.46] 06/26/2014  . Thrombocytopenia ([place]) [D69.6] 06/26/2014  . CKD (chronic kidney disease) stage 3, GFR 30-59 ml/min [N18.3] 06/26/2014  . Long term (current) use of anticoagulants [Z79.01] 06/26/2014  . OSA (obstructive sleep apnea) [G47.33] 06/26/2014  . NASH (nonalcoholic steatohepatitis) [K75.81] 06/26/2014  . Radiation proctitis [K62.7] 06/26/2014  . DM type 2 causing renal disease ([place]) [E11.29] 06/26/2014  . Acute renal failure ([place]) [N17.9] 06/25/2014  . Dehydration [E86.0] 06/25/2014  . Atrial fibrillation ([place]) [I48.91] 06/25/2014    Total Time spent with patient: 1 hour  Subjective:   Cameron Acosta is a 80 y.o. male patient admitted with delirium.  HPI:  Cameron Acosta is a 80 y.o. male with medical history significant of NASH, AICD, h/o prostate cancer now with PSA of 16, bladder CA resected in March of this year,And NIDDM.  Patient seen, chart reviewed for the face-to-face psychiatric consultation and evaluation for competency. Patient has mild symptoms of cognitive deficit secondary to recurrent and repeated delirium which were his himself frequent urinary tract infections. Patient appeared  awake, alert, oriented to place, person and time. Patient was not able to give a great information about current year and has to repeat himself to get right date. He has good concentration, immediate and delayed memory and concrete thought process based on his proverb interpretation. Patient has appropriate language functions and fund of knowledge about current events. Patient denies history of mental illness and safety concerns. Patient wishes to go home as he has been staying in the hospitals for several weeks in and out and like to be in his own place and does not feel physically and emotionally sick at this time. Patient wife who is at bedside agreed with his report and willing to take him home and be supportive for him.   Past Psychiatric History: None  Risk to Self: Is patient at risk for suicide?: No Risk to Others:   Prior Inpatient Therapy:   Prior Outpatient Therapy:    Past Medical History:  Past Medical History:  Diagnosis Date  . AICD (automatic cardioverter/defibrillator) present 06/26/2014  . Atrial fibrillation ([place])   . Cancer ([place])   . Cardiac pacemaker in situ 06/26/2014  . CHF (congestive heart failure) ([place])    EF 40-45% on echo 06/26/2014  . CKD (chronic kidney disease) stage 3, GFR 30-59 ml/min 06/26/2014  . History of prostate cancer 06/26/2014  . Hypertension   . Mitral regurgitation    Mild on echo 06/26/2014  . NASH (nonalcoholic steatohepatitis) 06/26/2014  . OSA (obstructive sleep apnea) 06/26/2014   on cpap  . Pacemaker   . Thrombocytopenia ([place]) 06/26/2014    Past Surgical History:  Procedure Laterality Date  . ablasion    . APPENDECTOMY    . CHOLECYSTECTOMY    . COLON SURGERY     for polyp  per pt (Dr. [facility] at [facility])  . COLONOSCOPY  2015   polyps (repeat per pt in 2018) Dr. Darden Amber at [facility] ARTHROSCOPY Left   . SHOULDER SURGERY Right   . UVULECTOMY     Family History:  Family History  Problem Relation Age of Onset  . Dementia Mother   .  Congestive Heart Failure Father    Family Psychiatric  History: Has no known family history of mental illness  Social History:  History  Alcohol Use  . 3.0 oz/week  . 5 Standard drinks or equivalent per week    Comment: occasionally     History  Drug Use No    Social History   Social History  . Marital status: Married    Spouse name: N/A  . Number of children: N/A  . Years of education: N/A   Social History Main Topics  . Smoking status: Former Smoker    Packs/day: 0.25    Years: 7.00    Types: Cigarettes    Quit date: 10/13/1982  . Smokeless tobacco: Never Used  . Alcohol use 3.0 oz/week    5 Standard drinks or equivalent per week     Comment: occasionally  . Drug use: No  . Sexual activity: Not Asked   Other Topics Concern  . None   Social History Narrative  . None   Additional Social History:    Allergies:   Allergies  Allergen Reactions  . Povidone Iodine Itching  . Zoledronic Acid Other (See Comments)    Flu-like symptoms with-in 4 hours of receiving medidcation  . Contrast Media [Iodinated Diagnostic Agents] Other (See Comments)    Unknown     Labs:  Results for orders placed or performed during the hospital encounter of 05/22/16 (from the past 48 hour(s))  Glucose, capillary     Status: Abnormal   Collection Time: 05/24/16  5:33 PM  Result Value Ref Range   Glucose-Capillary 208 (H) 65 - 99 mg/dL  Glucose, capillary     Status: Abnormal   Collection Time: 05/24/16  9:23 PM  Result Value Ref Range   Glucose-Capillary 179 (H) 65 - 99 mg/dL  Protime-INR     Status: Abnormal   Collection Time: 05/25/16  4:48 AM  Result Value Ref Range   Prothrombin Time 19.4 (H) 11.4 - 15.2 seconds   INR 1.62   CBC     Status: Abnormal   Collection Time: 05/25/16  4:48 AM  Result Value Ref Range   WBC 4.5 4.0 - 10.5 K/uL   RBC 3.25 (L) 4.22 - 5.81 MIL/uL   Hemoglobin 10.4 (L) 13.0 - 17.0 g/dL   HCT 31.8 (L) 39.0 - 52.0 %   MCV 97.8 78.0 - 100.0 fL   MCH  32.0 26.0 - 34.0 pg   MCHC 32.7 30.0 - 36.0 g/dL   RDW 14.0 11.5 - 15.5 %   Platelets 109 (L) 150 - 400 K/uL    Comment: CONSISTENT WITH PREVIOUS RESULT  Glucose, capillary     Status: Abnormal   Collection Time: 05/25/16  7:34 AM  Result Value Ref Range   Glucose-Capillary 168 (H) 65 - 99 mg/dL   Comment 1 Notify RN   Glucose, capillary     Status: Abnormal   Collection Time: 05/25/16 11:29 AM  Result Value Ref Range   Glucose-Capillary 234 (H) 65 - 99 mg/dL   Comment 1 Notify RN   Glucose, capillary     Status: Abnormal  Collection Time: 05/25/16  5:37 PM  Result Value Ref Range   Glucose-Capillary 206 (H) 65 - 99 mg/dL   Comment 1 Notify RN   Glucose, capillary     Status: Abnormal   Collection Time: 05/25/16  8:36 PM  Result Value Ref Range   Glucose-Capillary 157 (H) 65 - 99 mg/dL  Protime-INR     Status: Abnormal   Collection Time: 05/26/16  7:15 AM  Result Value Ref Range   Prothrombin Time 23.5 (H) 11.4 - 15.2 seconds   INR 2.05   Glucose, capillary     Status: Abnormal   Collection Time: 05/26/16  8:49 AM  Result Value Ref Range   Glucose-Capillary 168 (H) 65 - 99 mg/dL  Glucose, capillary     Status: Abnormal   Collection Time: 05/26/16 12:29 PM  Result Value Ref Range   Glucose-Capillary 162 (H) 65 - 99 mg/dL    Current Facility-Administered Medications  Medication Dose Route Frequency Provider Last Rate Last Dose  . albuterol (PROVENTIL) (2.5 MG/3ML) 0.083% nebulizer solution 2.5 mg  2.5 mg Nebulization Q6H PRN Kinnie Feil, MD      . amoxicillin (AMOXIL) capsule 500 mg  500 mg Oral Q8H Hosie Poisson, MD      . budesonide (PULMICORT) nebulizer solution 0.5 mg  0.5 mg Nebulization BID Etta Quill, DO   0.5 mg at 05/26/16 0849  . carvedilol (COREG) tablet 3.125 mg  3.125 mg Oral BID WC Kinnie Feil, MD   3.125 mg at 05/26/16 0910  . insulin aspart (novoLOG) injection 0-15 Units  0-15 Units Subcutaneous TID WC Etta Quill, DO   3 Units at 05/26/16  0910  . losartan (COZAAR) tablet 25 mg  25 mg Oral Daily Kinnie Feil, MD   25 mg at 05/26/16 0909  . oxyCODONE-acetaminophen (PERCOCET/ROXICET) 5-325 MG per tablet 1 tablet  1 tablet Oral Q6H PRN Kinnie Feil, MD   1 tablet at 05/26/16 0604  . pantoprazole (PROTONIX) EC tablet 40 mg  40 mg Oral Daily Etta Quill, DO   40 mg at 05/26/16 0910  . QUEtiapine (SEROQUEL) tablet 25 mg  25 mg Oral QHS Kinnie Feil, MD   25 mg at 05/25/16 2110  . rosuvastatin (CRESTOR) tablet 20 mg  20 mg Oral q1800 Kinnie Feil, MD   20 mg at 05/25/16 1812  . senna (SENOKOT) tablet 8.6 mg  1 tablet Oral BID Kinnie Feil, MD   8.6 mg at 05/26/16 0909  . torsemide (DEMADEX) tablet 50 mg  50 mg Oral Daily Kinnie Feil, MD   50 mg at 05/26/16 0909  . warfarin (COUMADIN) tablet 5 mg  5 mg Oral ONCE-1800 Hosie Poisson, MD      . Warfarin - Pharmacist Dosing Inpatient   Does not apply [id number] Kinnie Feil, MD        Musculoskeletal: Strength & Muscle Tone: decreased Gait & Station: unsteady, walks with the help of walker. Patient leans: Front  Psychiatric Specialty Exam: Physical Exam as for the history and physical   ROS patient has no current complaints but reportedly has history of fall, pelvic fracture, frequent in and out of the hospital physician for urinary tract infection. Patient denied shortness of breath and chest pain has a dry cough.  No Fever-chills, No Headache, No changes with Vision or hearing, reports vertigo No problems swallowing food or Liquids, No Chest pain, Cough or Shortness of Breath, No Abdominal pain, No  Nausea or Vommitting, Bowel movements are regular, No Blood in stool or Urine, No dysuria, No new skin rashes or bruises, No new joints pains-aches,  No new weakness, tingling, numbness in any extremity, No recent weight gain or loss, No polyuria, polydypsia or polyphagia,   A full 10 point Review of Systems was done, except as stated above, all other  Review of Systems were negative.  Blood pressure 116/62, pulse 70, temperature 99.2 F (37.3 C), temperature source Oral, resp. rate 18, height 5\' 7"  (1.702 m), weight 116.1 kg (256 lb), SpO2 98 %.Body mass index is 40.1 kg/m.  General Appearance: Casual  Eye Contact:  Good  Speech:  Clear and Coherent  Volume:  Normal  Mood:  Euthymic  Affect:  Appropriate and Congruent  Thought Process:  Coherent and Goal Directed  Orientation:  Full (Time, Place, and Person)  Thought Content:  WDL  Suicidal Thoughts:  No  Homicidal Thoughts:  No  Memory:  Immediate;   Good Recent;   Fair Remote;   Good  Judgement:  Intact  Insight:  Fair  Psychomotor Activity:  Normal  Concentration:  Concentration: Good and Attention Span: Good  Recall:  Good  Fund of Knowledge:  Good  Language:  Good  Akathisia:  Negative  Handed:  Right  AIMS (if indicated):     Assets:  Communication Skills Desire for Improvement Financial Resources/Insurance Housing Intimacy Leisure Time Resilience Social Support Transportation  ADL's:  Intact  Cognition:  Impaired,  Mild  Sleep:        Treatment Plan Summary: Patient is a 80 years old married male, retired Insurance account manager and lives with his wife who is a ex-RN. Patient has recurrent hospitalization secondary to multiple urinary tract infections and also has mild cognitive deficits. Patient reportedly feeling much better today and asking to go home. Patient has denied current safety concerns and reportedly walks with a walker at home. Patient was previously admitted to skilled nursing facility for rehabilitation.  Based on my evaluation today patient has mild cognitive deficits but overall has good insight, judgment and impulse components. Patient  of mak has capacity to make his own medical decisions and also living arrangements.    Patient and patient wife supports this evaluation and participated with cooperation.  Case discussed with Dr. Karleen Hampshire and patient  wife was at bedside  Appreciate psychiatric consultation and we sign off as of today Please contact [phone] or [phone] if needs further assistance  Disposition: Supportive therapy provided about ongoing stressors.  Ambrose Finland, MD 05/26/2016 1:31 PM

## 2016-05-26 NOTE — Progress Notes (Signed)
CSW confirmed that patient came to hospital from [facility]. Patient and family would like to return home today if possible with home health.  CSW signing off.  Percell Locus Tylik Treese LCSWA [phone]

## 2016-05-26 NOTE — Progress Notes (Signed)
OT Cancellation Note  Patient Details Name: Cameron Acosta MRN: [id number] DOB: Mar 25, 1936   Cancelled Treatment:    Reason Eval/Treat Not Completed: Other (comment). Pt sitting at EOB with 2 nurses and 1 nurse tech in room with pt attempting to get him to not get OOB without assist. Pt confused and agitated, adamantly refusing to work with OT or to transfer to recliner  Britt Bottom 05/26/2016, 12:19 PM

## 2016-05-26 NOTE — Progress Notes (Signed)
ANTICOAGULATION CONSULT NOTE - Follow Up Consult  Pharmacy Consult for Coumadin Indication: atrial fibrillation  Allergies  Allergen Reactions  . Povidone Iodine Itching  . Zoledronic Acid Other (See Comments)    Flu-like symptoms with-in 4 hours of receiving medidcation  . Contrast Media [Iodinated Diagnostic Agents] Other (See Comments)    Unknown     Patient Measurements: Height: 5\' 7"  (170.2 cm) Weight: 256 lb (116.1 kg) IBW/kg (Calculated) : 66.1  Vital Signs: Temp: 99.2 F (37.3 C) (08/14 0400) Temp Source: Oral (08/14 0400) BP: 116/62 (08/14 0400) Pulse Rate: 70 (08/14 0400)  Labs:  Recent Labs  05/24/16 0646 05/25/16 0448 05/26/16 0715  HGB 11.8* 10.4*  --   HCT 36.6* 31.8*  --   PLT 95* 109*  --   LABPROT  --  19.4* 23.5*  INR  --  1.62 2.05  CREATININE 1.14  --   --     Estimated Creatinine Clearance: 66.1 mL/min (by C-G formula based on SCr of 1.14 mg/dL).   Assessment: 80 year old male admitted from rehab after a fall and found to have non-displaced R-hip fracture. No surgical plans per Dr. Daleen Bo - resume Coumadin per pharmacy. Spoke with patient's daughter (also a Marine scientist) on 8/11 and she reported that patient has been refusing Coumadin as well as other medications and diet at the rehab facility. Per information from rehab facility - he was to be on 5mg  daily and they recorded the last dose at 8/10.   INR now therapeutic at 2.05. Small drop in Hgb 11.8>10.4. Plts 109 - 122 on admit likely secondary to cirrhosis. Will monitor closely. LFTs on admission are within normal limits.   Goal of Therapy:  INR 2-3 Monitor platelets by anticoagulation protocol: Yes   Plan:  Warfarin 5 mg again tonight Daily PT/INR Watch platelets  Stephens November, PharmD Clinical Pharmacist 11:32 AM, 05/26/2016

## 2016-05-26 NOTE — Progress Notes (Signed)
New foley placed for chronic catheter use. Pt. Has foley changed once a month, and it was due to be changed. Discussed with MD and charge nurse. Foley placed, pt. Did well with placement. Will continue to monitor.

## 2016-05-26 NOTE — Progress Notes (Signed)
Physical Therapy Treatment Patient Details Name: Cameron Acosta MRN: [id number] DOB: 12/01/1935 Today's Date: 05/26/2016    History of Present Illness Patient is a 80 yo male admitted 05/22/16 after fall with incomplete Rt hip fx.  Patient also with delerium/confusion.   Per ortho, no surgery indicated; ambulate WBAT.   PMH:  recently admitted to [facility] for pneumonia, delirium and GI bleed discharged to rehab, CAD, A fib (on coumadin), CHF, Bi-V ICD, CKD, Cirrhosis 2/2 NASH,  OSA on CPAP, hx of prostate cancer s/p XRT and hx of bladder cancer s/p TUR bladder tumor (on 01/01/16), bronchial lesion/lytic t4 lesion, chronic foley cath    PT Comments    Patient is progressing toward mobility goals. Stair training this session with min guard and cues for safety as pt continues to demo decreased safety awareness. Pt grossly min guard for ambulation however needed assist to manage RW as he has tendency to drift to L side and run into objects without cues. No LOB.  Pt reported someone will be home with him 24 hours/day. Current plan remains appropriate.    Follow Up Recommendations  Home health PT;Supervision/Assistance - 24 hour;Other (comment) ([place])     Equipment Recommendations  Wheelchair (measurements PT);Wheelchair cushion (measurements PT)    Recommendations for Other Services       Precautions / Restrictions Precautions Precautions: Fall Restrictions Weight Bearing Restrictions: Yes RLE Weight Bearing: Weight bearing as tolerated    Mobility  Bed Mobility Overal bed mobility: Needs Assistance Bed Mobility: Rolling;Sidelying to Sit Rolling: Supervision Sidelying to sit: Min assist       General bed mobility comments: multimodal cues for sequencing and technique; assist to elevate trunk into sitting; use of rail; HOB flat  Transfers Overall transfer level: Needs assistance Equipment used: Rolling walker (2 wheeled) Transfers: Sit to/from Stand Sit to  Stand: Min guard;From elevated surface         General transfer comment: cues for hand placement and safe use of AD; pt with tendency to pull on RW; min guard for safety; pt began backing up to recliner too far away despite max cues to get closer to surface he is going to sit on   Ambulation/Gait Ambulation/Gait assistance: Min guard;Min assist Ambulation Distance (Feet): 150 Feet Assistive device: Rolling walker (2 wheeled) Gait Pattern/deviations: Step-through pattern;Decreased stride length;Decreased dorsiflexion - right;Drifts right/left Gait velocity: decreased   General Gait Details: grossly min guard however assist needed to manage RW as pt has tendency to drift L and run into objects on L side; pt with improved bilat step symmetry; cues for posture and R heel strike   Stairs Stairs: Yes Stairs assistance: Min guard Stair Management: One rail Right;Sideways Number of Stairs: 2 General stair comments: educated pt on sequencing and technique; min guard for safety  Wheelchair Mobility    Modified Rankin (Stroke Patients Only)       Balance     Sitting balance-Leahy Scale: Fair       Standing balance-Leahy Scale: Poor                      Cognition Arousal/Alertness: Awake/alert Behavior During Therapy: WFL for tasks assessed/performed;Restless;Impulsive Overall Cognitive Status: No family/caregiver present to determine baseline cognitive functioning Area of Impairment: Memory;Following commands;Safety/judgement;Problem solving;Awareness     Memory: Decreased short-term memory Following Commands: Follows multi-step commands with increased time Safety/Judgement: Decreased awareness of deficits;Decreased awareness of safety Awareness: Emergent Problem Solving: Slow processing;Difficulty sequencing;Requires verbal cues;Requires tactile cues  Exercises General Exercises - Lower Extremity Ankle Circles/Pumps: AROM;Both;10 reps;Supine Heel Slides:  AROM;Both;10 reps;Seated Hip ABduction/ADduction: AROM;Both;10 reps;Seated    General Comments        Pertinent Vitals/Pain Pain Assessment: Faces Faces Pain Scale: Hurts a little bit Pain Location: R hip Pain Descriptors / Indicators: Discomfort Pain Intervention(s): Limited activity within patient's tolerance;Monitored during session;Repositioned    Home Living                      Prior Function            PT Goals (current goals can now be found in the care plan section) Progress towards PT goals: Progressing toward goals    Frequency  Min 5X/week    PT Plan Current plan remains appropriate    Co-evaluation             End of Session Equipment Utilized During Treatment: Gait belt Activity Tolerance: Patient tolerated treatment well Patient left: with call bell/phone within reach;in chair;with chair alarm set     Time: [phone] PT Time Calculation (min) (ACUTE ONLY): 35 min  Charges:  $Gait Training: 8-22 mins $Therapeutic Exercise: 8-22 mins                    G Codes:      Salina April, PTA Pager: [phone]   05/26/2016, 10:10 AM

## 2016-05-26 NOTE — Care Management Note (Signed)
Case Management Note  Patient Details  Name: Cameron Acosta MRN: [id number] Date of Birth: 11-01-1935  Subjective/Objective:             Admitted s/p fall with incomplete Rt hip fx. / delerium/confusion. PMH:  recently admitted to [facility] for pneumonia, delirium and GI bleed discharged to rehab, CAD, A fib (on coumadin), CHF, Bi-V ICD, CKD, Cirrhosis 2/2 NASH,  OSA on CPAP, hx of prostate cancer s/p XRT and hx of bladder cancer s/p TUR bladder tumor (on 01/01/16), bronchial lesion/lytic t4 lesion, chronic foley. Resides with wife.  PCP: Arelia Longest- Rexene Alberts      Action/Plan: Plan is to d/c today with home health services.   Expected Discharge Date:   05/26/2016            Expected Discharge Plan:  [place]  In-House Referral:     Discharge planning Services  CM Consult  Post Acute Care Choice:    Choice offered to:  Spouse [name])  DME Arranged:  Wheelchair manual DME Agency:  [employer] referral made with [place] @ [phone]  HH Arranged:  RN, PT, OT, Nurse's Aide, Social Work [employer] Agency:  [facility] ([facility] referral made with [place]  Status of Service:  Completed, signed off  If discussed at [place] of Stay Meetings, dates discussed:    Additional Comments:  Sharin Mons, RN 05/26/2016, 11:03 AM

## 2016-05-27 NOTE — Discharge Summary (Signed)
Physician Discharge Summary  Cameron Acosta [id number] DOB: Jun 11, 1936 DOA: 05/22/2016  PCP: Liliane Shi, MD  Admit date: 05/22/2016 Discharge date: 05/26/2016  Admitted From: HOme.  Disposition: Home.   Recommendations for Outpatient Follow-up:  1. Follow up with PCP in 1-2 weeks 2. Please obtain BMP/CBC in one week  Home Health: yes.   Discharge Condition:stable. CODE STATUS:FULL code.  Diet recommendation: Heart Healthy / Carb Modified   Brief/Interim Summary: 80 y.o. male with PMH of CAD, A fib (on coumadin), CHF, Bi-V ICD, CKD, Cirrhosis 2/2 NASH,  OSA on CPAP, hx of prostate cancer s/p XRT and hx of bladder cancer s/p TURP bladder tumor (on 01/01/16) chronic foley cath, bronchial lesion/lytic t4 lesion, recently admitted to [facility] for pneumonia, delirium and GI bleed discharged to rehab. He presented to emergency room after a fall. Found to have incomplete right hip fracture. Orthopedics recommended physical therapy ambulating weightbearing as tolerated. Patient also has h/o intermittent delirium. On admission, noted to be confused. Neuro exam was non focal, ct head: no acute infarcts. Delirium-> somewhat improved after discontinued methadone,  benzodiazepines, but continuous to be delirious requiring a sitter at bedside. Consulted psychiatry evaluation , recommended he has capacity to make decisions.   Discharge Diagnoses:  Principal Problem:   Delirium Active Problems:   CKD (chronic kidney disease) stage 3, GFR 30-59 ml/min   NASH (nonalcoholic steatohepatitis)   DM type 2 causing renal disease (HCC)   Closed nondisplaced fracture of greater trochanter of right femur (HCC)   Pressure ulcer  Fall. Acute, incomplete fracture of the right greater trochanter. Patient is not well ambulatory at baseline.  -Orthopedics recommended physical therapy ambulating weightbearing as tolerated. cont PT/OT. Fall precautions.   Delirium. Ongoing since admission  to [facility]. Thought due to scheduled opioids (he was discharged on methadone and benzodiazepines from [place]). Also has UTI. Questionable underlying MCI. neuro exam is non focal. CT head: no acute infarcts, but atrophy. Unable to obtain MRI due to PPM -somewhat improved in delirium with holding methadone and benzo and treating UTI. D/w patient, his wife, daughter at length. Patient remains delirious at times, wants to go home.  They have requested psychiatry evaluation for competency, pscyhiatry recommended pt has capacity and he was discharged home with wife. . start Seroquel QHS  Left lower lobe central obstructing lesion with ill defined T4 lytic lesion. Patient, family has follow up apointment at [facility] in august  UTI. Grew enterococcus sensitive to amoxicillin. Discharged on the same.    CAD, A fib (on coumadin), CHF, Bi-V ICD. No acute chest pains. Will d/c IVF. D/w patient, his family at length. They have requested to restart coumadin, patient was already on coumadin at rehab. No s/s of bleeding     Discharge Instructions  Discharge Instructions    Diet - low sodium heart healthy    Complete by:  As directed   Discharge instructions    Complete by:  As directed   Follow up with PCP in one week.  Please follow up with cardiology as recommended.       Medication List    STOP taking these medications   cefpodoxime 100 MG tablet Commonly known as:  VANTIN   haloperidol 0.5 MG tablet Commonly known as:  HALDOL   hydrALAZINE 25 MG tablet Commonly known as:  APRESOLINE   LORazepam 2 MG/ML injection Commonly known as:  ATIVAN   methadone 5 MG tablet Commonly known as:  DOLOPHINE   oxyCODONE 5 MG immediate release  tablet Commonly known as:  Oxy IR/ROXICODONE   sertraline 50 MG tablet Commonly known as:  ZOLOFT     TAKE these medications   albuterol 108 (90 Base) MCG/ACT inhaler Commonly known as:  PROVENTIL HFA;VENTOLIN HFA Inhale 2 puffs into the  lungs every 6 (six) hours as needed for wheezing or shortness of breath.   amoxicillin 500 MG capsule Commonly known as:  AMOXIL Take 1 capsule (500 mg total) by mouth 3 (three) times daily.   arformoterol 15 MCG/2ML Nebu Commonly known as:  BROVANA Take 15 mcg by nebulization 2 (two) times daily.   aspirin 81 MG chewable tablet Chew 81 mg by mouth daily.   budesonide 0.5 MG/2ML nebulizer solution Commonly known as:  PULMICORT Take 0.5 mg by nebulization 2 (two) times daily.   carvedilol 3.125 MG tablet Commonly known as:  COREG Take 1 tablet (3.125 mg total) by mouth 2 (two) times daily with a meal. What changed:  medication strength  how much to take   DELSYM 30 MG/5ML liquid Generic drug:  dextromethorphan Take 60 mg by mouth 2 (two) times daily.   folic acid 1 MG tablet Commonly known as:  FOLVITE Take 1 mg by mouth daily.   gabapentin 100 MG capsule Commonly known as:  NEURONTIN Take 100 mg by mouth daily.   glipiZIDE 5 MG tablet Commonly known as:  GLUCOTROL Take 5-10 mg by mouth 2 (two) times daily before a meal. Take 2 tablets every morning and take 1 tablet every evening   guaiFENesin-codeine 100-10 MG/5ML syrup Take 10 mLs by mouth every 4 (four) hours as needed for cough.   HYDROcodone-acetaminophen 7.5-325 MG tablet Commonly known as:  NORCO Take 1 tablet by mouth every 4 (four) hours as needed for moderate pain.   losartan 25 MG tablet Commonly known as:  COZAAR Take 1 tablet (25 mg total) by mouth daily. What changed:  medication strength  how much to take  when to take this   multivitamin with minerals Tabs tablet Take 1 tablet by mouth daily.   omeprazole 20 MG capsule Commonly known as:  PRILOSEC Take 20 mg by mouth 2 (two) times daily before a meal.   QUEtiapine 25 MG tablet Commonly known as:  SEROQUEL Take 1 tablet (25 mg total) by mouth at bedtime.   rosuvastatin 20 MG tablet Commonly known as:  CRESTOR Take 20 mg by mouth  daily.   thiamine 50 MG tablet Commonly known as:  VITAMIN B-1 Take 50 mg by mouth daily.   torsemide 100 MG tablet Commonly known as:  DEMADEX Take 50 mg by mouth daily. What changed:  Another medication with the same name was removed. Continue taking this medication, and follow the directions you see here.   warfarin 5 MG tablet Commonly known as:  COUMADIN Take 5 mg by mouth daily.   zolpidem 10 MG tablet Commonly known as:  AMBIEN Take 10 mg by mouth at bedtime.      Follow-up Information    DUDA,MARCUS V, MD Follow up in 2 week(s).   Specialty:  Orthopedic Surgery Contact information: [place] [address] [phone]        [facility] .   Why:  home health services arranged (RN,PT,OT,NA,SW). Contact information: [place] [address] [phone]        WIGAND-BOLLING,GWENDOLYN, MD. Schedule an appointment as soon as possible for a visit in 1 week(s).   Specialty:  Family Medicine Contact  information: [place] [address] [phone]          Allergies  Allergen Reactions  . Povidone Iodine Itching  . Zoledronic Acid Other (See Comments)    Flu-like symptoms with-in 4 hours of receiving medidcation  . Contrast Media [Iodinated Diagnostic Agents] Other (See Comments)    Unknown     Consultations:  Psychiatry.   Procedures/Studies: Dg Chest 1 View  Result Date: 05/22/2016 CLINICAL DATA:  Cough, pneumonia. EXAM: CHEST 1 VIEW COMPARISON:  Radiograph of June 26, 2014. FINDINGS: Stable cardiomegaly. Right-sided pacemaker is unchanged in position. No pneumothorax or pleural effusion is noted. No acute pulmonary disease is noted. Bony thorax is unremarkable. IMPRESSION: No acute cardiopulmonary abnormality seen. Electronically Signed   By: Marijo Conception, M.D.   On: 05/22/2016 19:30   Ct Head Wo Contrast  Result Date: 05/22/2016 CLINICAL DATA:  Status  post fall today.  History of prostate cancer. EXAM: CT HEAD WITHOUT CONTRAST TECHNIQUE: Contiguous axial images were obtained from the base of the skull through the vertex without intravenous contrast. COMPARISON:  Head CT scan 08/22/2014. FINDINGS: There is some cortical atrophy and chronic microvascular ischemic change. No evidence of acute intracranial abnormality including hemorrhage, infarct, mass lesion, mass effect, midline shift or abnormal extra-axial fluid collection is seen. No hydrocephalus or pneumocephalus. The calvarium is intact. Imaged paranasal sinuses and mastoid air cells are clear. IMPRESSION: No acute abnormality. Atrophy and chronic microvascular ischemic change. Electronically Signed   By: Inge Rise M.D.   On: 05/22/2016 19:58   Dg Hip Unilat With Pelvis 2-3 Views Right  Result Date: 05/22/2016 CLINICAL DATA:  Status post fall today with onset of right hip pain. Initial encounter. EXAM: DG HIP (WITH OR WITHOUT PELVIS) 2-3V RIGHT COMPARISON:  CT pelvis 08/22/2014. FINDINGS: Linear lucency is seen in the greater trochanter extending toward the lesser trochanter and new since the prior examination consistent with acute fracture. The fracture appears incomplete. The patient has remote healed intertrochanteric fracture. Fixation hardware is in place without complicating feature. IMPRESSION: Acute, incomplete fracture of the right greater trochanter. Remote healed intertrochanteric fracture with fixation hardware in place noted. Electronically Signed   By: Inge Rise M.D.   On: 05/22/2016 19:33    (Echo, Carotid, EGD, Colonoscopy, ERCP)    Subjective:   Discharge Exam: Vitals:   05/26/16 0400 05/26/16 1451  BP: 116/62 (!) 110/59  Pulse: 70 70  Resp: 18 20  Temp: 99.2 F (37.3 C) 97.4 F (36.3 C)   Vitals:   05/25/16 2046 05/25/16 2100 05/26/16 0400 05/26/16 1451  BP:  129/64 116/62 (!) 110/59  Pulse:  71 70 70  Resp:   18 20  Temp:  99.3 F (37.4 C) 99.2 F  (37.3 C) 97.4 F (36.3 C)  TempSrc:  Oral Oral Oral  SpO2: 99% 98% 98% 94%  Weight:      Height:        General: Pt is alert, awake, not in acute distress Cardiovascular: RRR, S1/S2 +, no rubs, no gallops Respiratory: CTA bilaterally, no wheezing, no rhonchi Abdominal: Soft, NT, ND, bowel sounds + Extremities: no edema, no cyanosis    The results of significant diagnostics from this hospitalization (including imaging, microbiology, ancillary and laboratory) are listed below for reference.     Microbiology: Recent Results (from the past 240 hour(s))  MRSA PCR Screening     Status: None   Collection Time: 05/23/16 10:39 AM  Result Value Ref Range Status  MRSA by PCR NEGATIVE NEGATIVE Final    Comment:        The GeneXpert MRSA Assay (FDA approved for NASAL specimens only), is one component of a comprehensive MRSA colonization surveillance program. It is not intended to diagnose MRSA infection nor to guide or monitor treatment for MRSA infections.   Culture, Urine     Status: Abnormal   Collection Time: 05/23/16  2:11 PM  Result Value Ref Range Status   Specimen Description URINE, CLEAN CATCH  Final   Special Requests NONE  Final   Culture (A)  Final    >=100,000 COLONIES/mL ENTEROCOCCUS SPECIES >=100,000 COLONIES/mL YEAST    Report Status 05/26/2016 FINAL  Final   Organism ID, Bacteria ENTEROCOCCUS SPECIES (A)  Final      Susceptibility   Enterococcus species - MIC*    AMPICILLIN <=2 SENSITIVE Sensitive     LEVOFLOXACIN >=8 RESISTANT Resistant     NITROFURANTOIN <=16 SENSITIVE Sensitive     VANCOMYCIN 1 SENSITIVE Sensitive     * >=100,000 COLONIES/mL ENTEROCOCCUS SPECIES     Labs: BNP (last 3 results) No results for input(s): BNP in the last 8760 hours. Basic Metabolic Panel:  Recent Labs Lab 05/22/16 1902 05/23/16 0423 05/24/16 0646  NA 137 141 143  K 3.5 3.0* 3.3*  CL 102 105 107  CO2 26 25 25   GLUCOSE 243* 173* 169*  BUN 50* 40* 23*   CREATININE 1.95* 1.42* 1.14  CALCIUM 9.2 8.9 9.1   Liver Function Tests:  Recent Labs Lab 05/22/16 1902  AST 27  ALT 21  ALKPHOS 102  BILITOT 1.6*  PROT 7.3  ALBUMIN 3.4*   No results for input(s): LIPASE, AMYLASE in the last 168 hours.  Recent Labs Lab 05/22/16 1902  AMMONIA 23   CBC:  Recent Labs Lab 05/22/16 1902 05/24/16 0646 05/25/16 0448  WBC 7.1 3.3* 4.5  NEUTROABS 5.1  --   --   HGB 12.0* 11.8* 10.4*  HCT 34.3* 36.6* 31.8*  MCV 95.8 97.3 97.8  PLT 122* 95* 109*   Cardiac Enzymes:  Recent Labs Lab 05/22/16 1902  TROPONINI 0.03*   BNP: Invalid input(s): POCBNP CBG:  Recent Labs Lab 05/25/16 1129 05/25/16 1737 05/25/16 2036 05/26/16 0849 05/26/16 1229  GLUCAP 234* 206* 157* 168* 162*   D-Dimer No results for input(s): DDIMER in the last 72 hours. Hgb A1c No results for input(s): HGBA1C in the last 72 hours. Lipid Profile No results for input(s): CHOL, HDL, LDLCALC, TRIG, CHOLHDL, LDLDIRECT in the last 72 hours. Thyroid function studies No results for input(s): TSH, T4TOTAL, T3FREE, THYROIDAB in the last 72 hours.  Invalid input(s): FREET3 Anemia work up No results for input(s): VITAMINB12, FOLATE, FERRITIN, TIBC, IRON, RETICCTPCT in the last 72 hours. Urinalysis    Component Value Date/Time   COLORURINE YELLOW 05/22/2016 2300   APPEARANCEUR CLOUDY (A) 05/22/2016 2300   LABSPEC 1.018 05/22/2016 2300   PHURINE 5.5 05/22/2016 2300   GLUCOSEU NEGATIVE 05/22/2016 2300   HGBUR MODERATE (A) 05/22/2016 2300   BILIRUBINUR NEGATIVE 05/22/2016 2300   KETONESUR NEGATIVE 05/22/2016 2300   PROTEINUR 30 (A) 05/22/2016 2300   UROBILINOGEN 0.2 06/26/2014 1655   NITRITE NEGATIVE 05/22/2016 2300   LEUKOCYTESUR LARGE (A) 05/22/2016 2300   Sepsis Labs Invalid input(s): PROCALCITONIN,  WBC,  LACTICIDVEN Microbiology Recent Results (from the past 240 hour(s))  MRSA PCR Screening     Status: None   Collection Time: 05/23/16 10:39 AM  Result  Value Ref Range Status  MRSA by PCR NEGATIVE NEGATIVE Final    Comment:        The GeneXpert MRSA Assay (FDA approved for NASAL specimens only), is one component of a comprehensive MRSA colonization surveillance program. It is not intended to diagnose MRSA infection nor to guide or monitor treatment for MRSA infections.   Culture, Urine     Status: Abnormal   Collection Time: 05/23/16  2:11 PM  Result Value Ref Range Status   Specimen Description URINE, CLEAN CATCH  Final   Special Requests NONE  Final   Culture (A)  Final    >=100,000 COLONIES/mL ENTEROCOCCUS SPECIES >=100,000 COLONIES/mL YEAST    Report Status 05/26/2016 FINAL  Final   Organism ID, Bacteria ENTEROCOCCUS SPECIES (A)  Final      Susceptibility   Enterococcus species - MIC*    AMPICILLIN <=2 SENSITIVE Sensitive     LEVOFLOXACIN >=8 RESISTANT Resistant     NITROFURANTOIN <=16 SENSITIVE Sensitive     VANCOMYCIN 1 SENSITIVE Sensitive     * >=100,000 COLONIES/mL ENTEROCOCCUS SPECIES     Time coordinating discharge: Over 30 minutes  SIGNED:   Hosie Poisson, MD  [facility] 05/27/2016, 7:55 AM Pager   If 7PM-7AM, please contact night-coverage [email or web address] Password [email or web address]

## 2016-07-13 DEATH — deceased

## 2016-10-16 IMAGING — CT CT HEAD W/O CM
3 of 4 series · 17 of 47 positions shown, 20 images · non-contrast
Comparison: Head CT scan 08/22/2014.

CLINICAL DATA: Status post fall today.  History of prostate cancer.

EXAM:
CT HEAD WITHOUT CONTRAST
TECHNIQUE: Contiguous axial images were obtained from the base of the skull
through the vertex without intravenous contrast.

[Series 201: head w/o, idose (1) · axial · non-contrast · 0.42mm/px · z∈[+292,+437]mm · 11 of 35 slices shown, 14 images]
[im 3/35  brain]
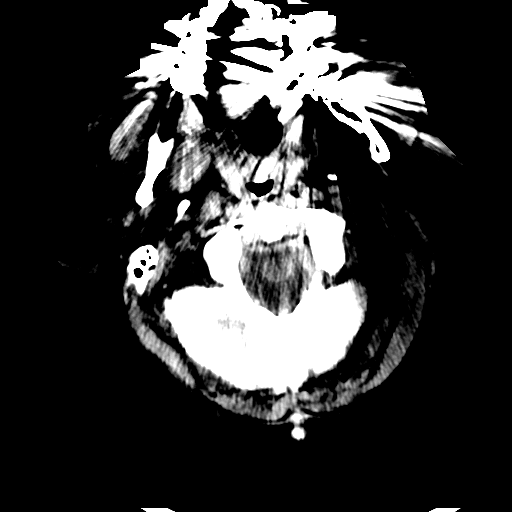
[im 3/35  bone]
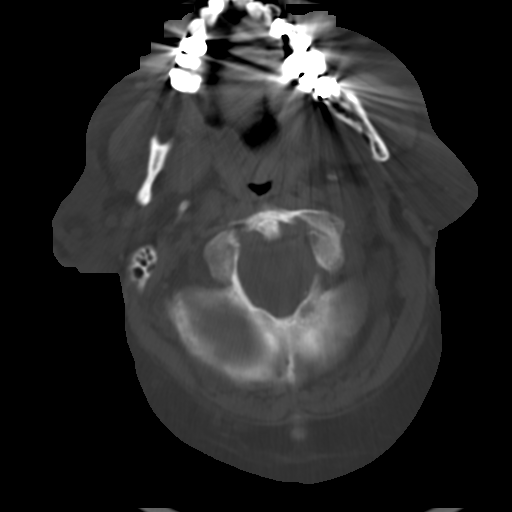
[im 5/35  brain]
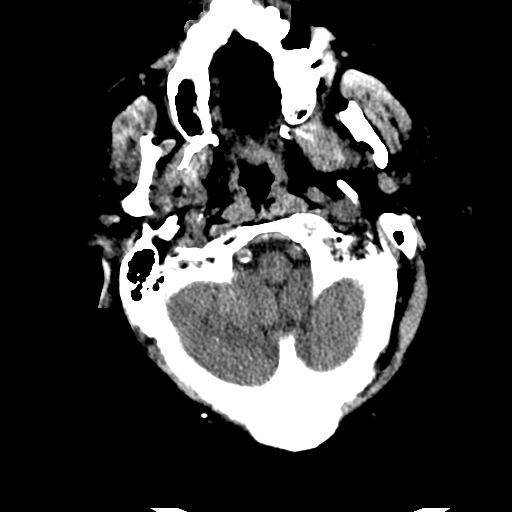
[im 8/35  brain]
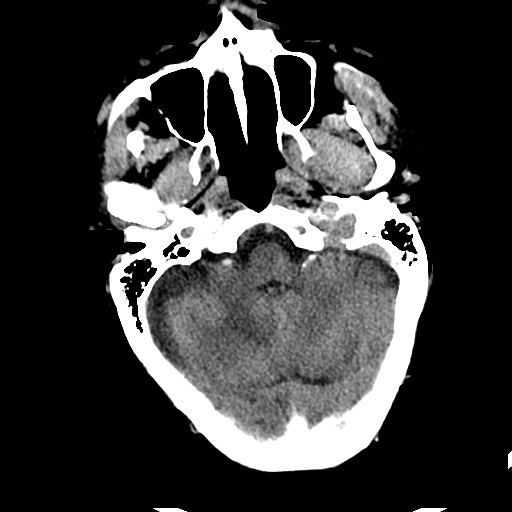
[im 13/35  brain]
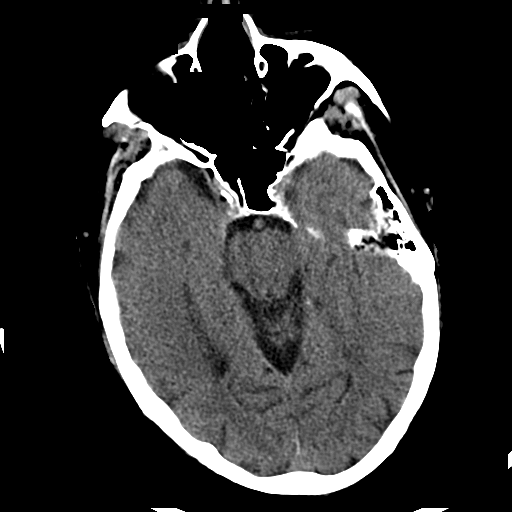
[im 15/35  brain]
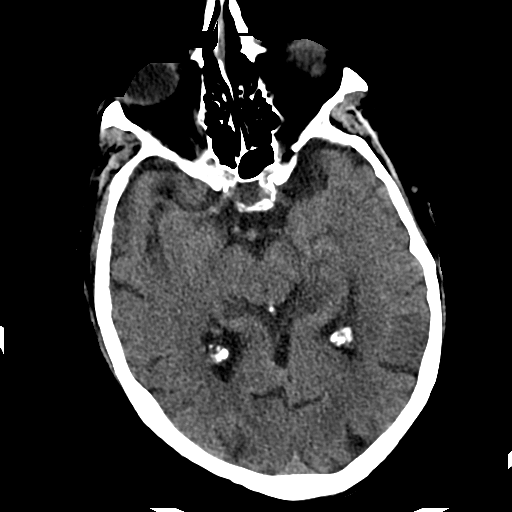
[im 15/35  bone]
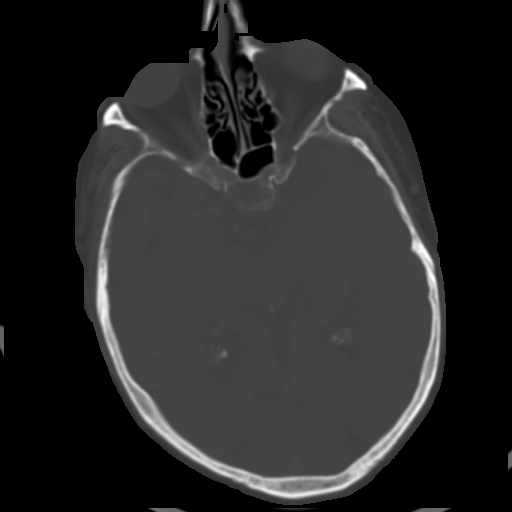
[im 18/35  brain]
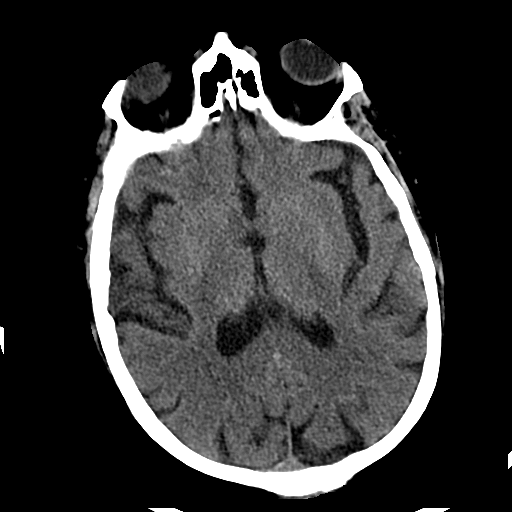
[im 20/35  brain]
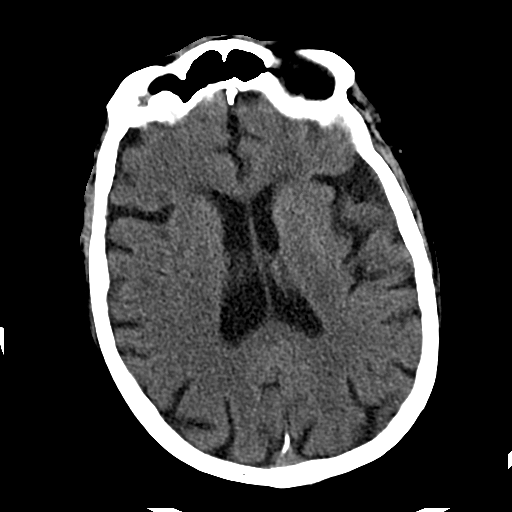
[im 22/35  brain]
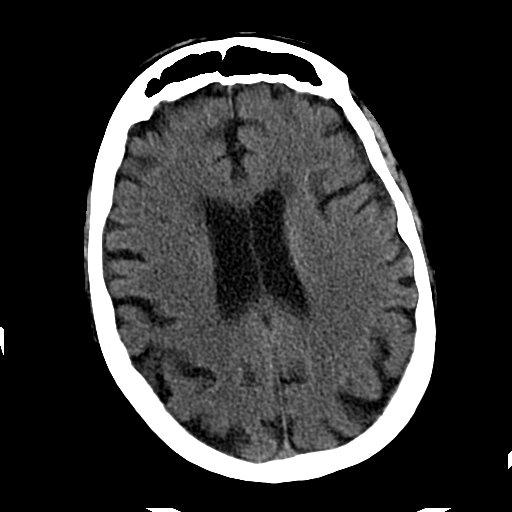
[im 27/35  brain]
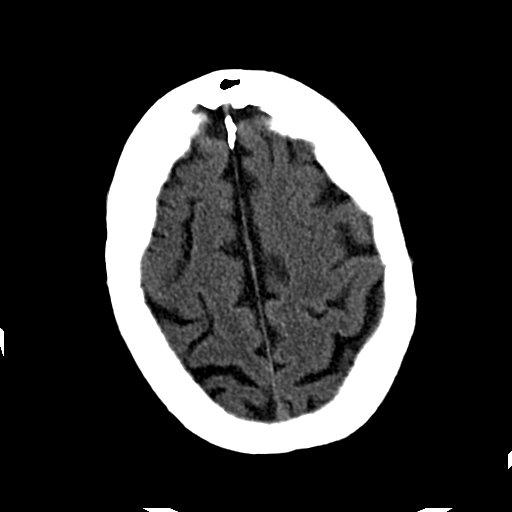
[im 27/35  bone]
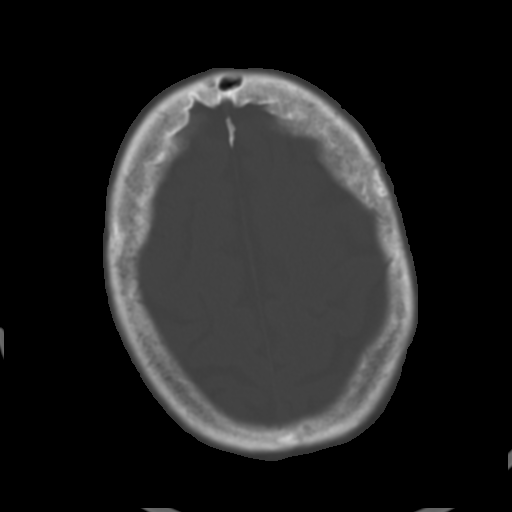
[im 30/35  brain]
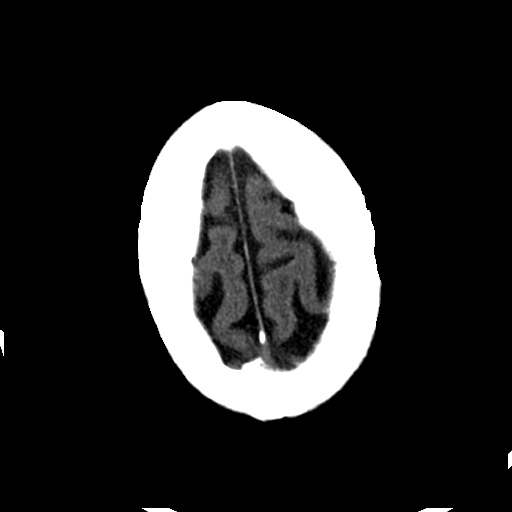
[im 32/35  brain]
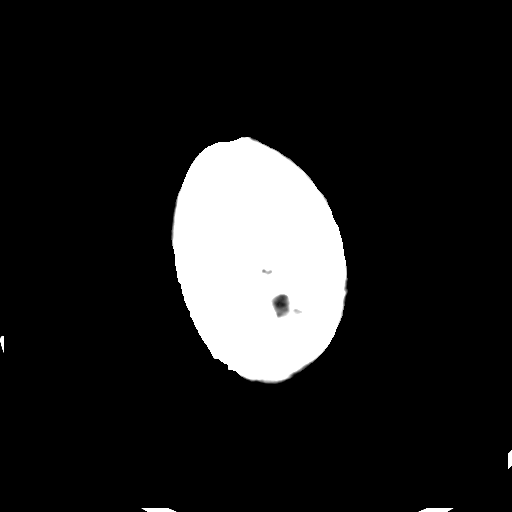

[Series 203: coronal st, idose (1) · coronal · 0.40mm/px · 3 of 71 slices shown]
[im 24/71  brain]
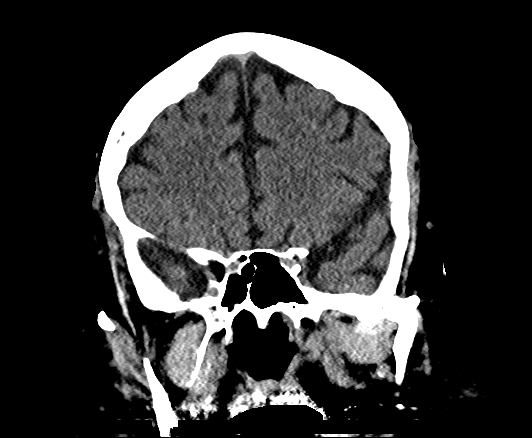
[im 32/71  brain]
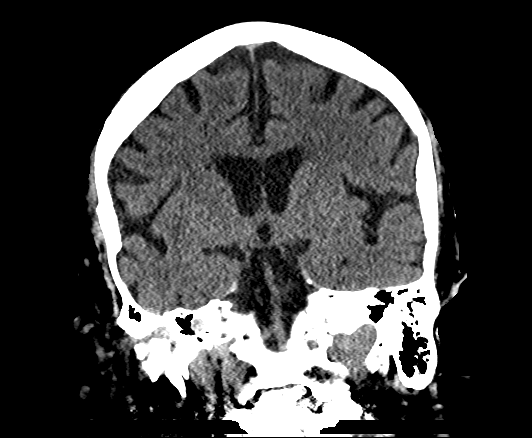
[im 39/71  brain]
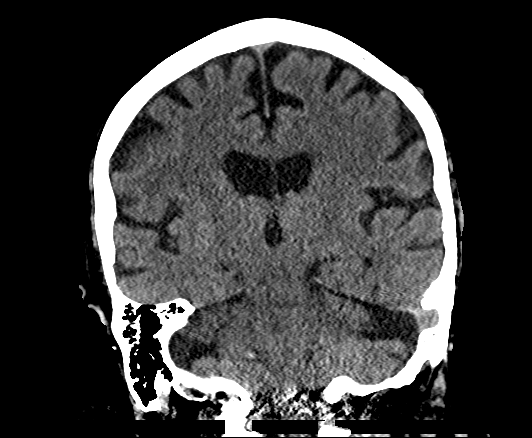

[Series 204: sagittal st, idose (1) · sagittal · 0.40mm/px · 3 of 71 slices shown]
[im 24/71  brain]
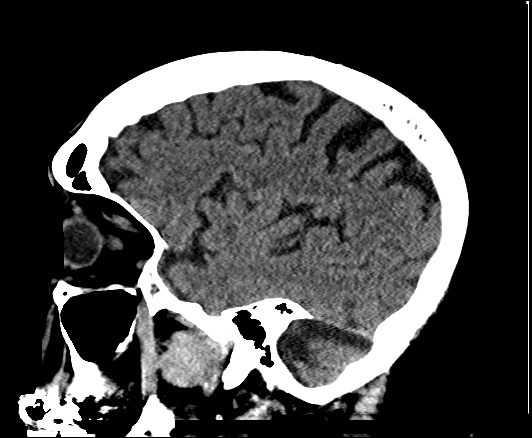
[im 36/71  brain]
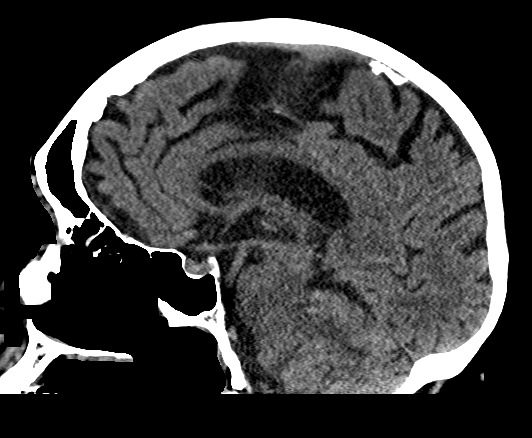
[im 47/71  brain]
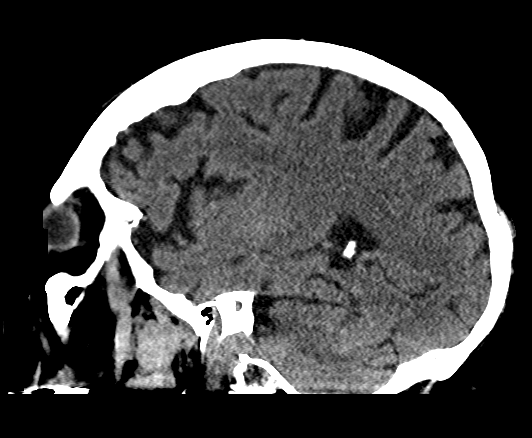

[17 of 47 positions shown; findings below may reference images not displayed]

FINDINGS: There is some cortical atrophy and chronic microvascular ischemic
change. No evidence of acute intracranial abnormality including
hemorrhage, infarct, mass lesion, mass effect, midline shift or
abnormal extra-axial fluid collection is seen. No hydrocephalus or
pneumocephalus. The calvarium is intact. Imaged paranasal sinuses
and mastoid air cells are clear.
IMPRESSION: No acute abnormality.

Atrophy and chronic microvascular ischemic change.
